# Patient Record
Sex: Female | Born: 1980 | Race: White | Hispanic: No | Marital: Single | Smoking: Former smoker
Health system: Southern US, Community
[De-identification: ages and names within clinical notes are randomized; demographics above are authoritative.]

## PROBLEM LIST (undated history)

## (undated) DIAGNOSIS — Z9189 Other specified personal risk factors, not elsewhere classified: Secondary | ICD-10-CM

## (undated) DIAGNOSIS — F32A Depression, unspecified: Secondary | ICD-10-CM

## (undated) DIAGNOSIS — K2289 Other specified disease of esophagus: Secondary | ICD-10-CM

## (undated) DIAGNOSIS — G44209 Tension-type headache, unspecified, not intractable: Secondary | ICD-10-CM

## (undated) DIAGNOSIS — G43909 Migraine, unspecified, not intractable, without status migrainosus: Secondary | ICD-10-CM

## (undated) DIAGNOSIS — F329 Major depressive disorder, single episode, unspecified: Secondary | ICD-10-CM

## (undated) DIAGNOSIS — K228 Other specified diseases of esophagus: Secondary | ICD-10-CM

## (undated) DIAGNOSIS — F988 Other specified behavioral and emotional disorders with onset usually occurring in childhood and adolescence: Secondary | ICD-10-CM

## (undated) DIAGNOSIS — E785 Hyperlipidemia, unspecified: Secondary | ICD-10-CM

## (undated) DIAGNOSIS — G4700 Insomnia, unspecified: Secondary | ICD-10-CM

## (undated) HISTORY — DX: Depression, unspecified: F32.A

## (undated) HISTORY — DX: Migraine, unspecified, not intractable, without status migrainosus: G43.909

## (undated) HISTORY — PX: APPENDECTOMY: SHX54

## (undated) HISTORY — DX: Other specified personal risk factors, not elsewhere classified: Z91.89

## (undated) HISTORY — DX: Hyperlipidemia, unspecified: E78.5

## (undated) HISTORY — DX: Major depressive disorder, single episode, unspecified: F32.9

---

## 1999-02-04 ENCOUNTER — Emergency Department (HOSPITAL_COMMUNITY): Admission: EM | Admit: 1999-02-04 | Discharge: 1999-02-04 | Payer: Self-pay | Admitting: Podiatry

## 2012-06-02 DIAGNOSIS — G44209 Tension-type headache, unspecified, not intractable: Secondary | ICD-10-CM | POA: Insufficient documentation

## 2012-06-04 ENCOUNTER — Encounter (HOSPITAL_COMMUNITY): Payer: Self-pay | Admitting: *Deleted

## 2012-06-04 ENCOUNTER — Emergency Department (HOSPITAL_COMMUNITY)
Admission: EM | Admit: 2012-06-04 | Discharge: 2012-06-04 | Disposition: A | Discharge status: Home / Self Care | Payer: No Typology Code available for payment source | Attending: Emergency Medicine | Admitting: Emergency Medicine

## 2012-06-04 DIAGNOSIS — Z79899 Other long term (current) drug therapy: Secondary | ICD-10-CM | POA: Insufficient documentation

## 2012-06-04 DIAGNOSIS — G4700 Insomnia, unspecified: Secondary | ICD-10-CM | POA: Insufficient documentation

## 2012-06-04 DIAGNOSIS — F17200 Nicotine dependence, unspecified, uncomplicated: Secondary | ICD-10-CM | POA: Insufficient documentation

## 2012-06-04 DIAGNOSIS — T7421XA Adult sexual abuse, confirmed, initial encounter: Secondary | ICD-10-CM | POA: Insufficient documentation

## 2012-06-04 DIAGNOSIS — Z3202 Encounter for pregnancy test, result negative: Secondary | ICD-10-CM | POA: Insufficient documentation

## 2012-06-04 DIAGNOSIS — F988 Other specified behavioral and emotional disorders with onset usually occurring in childhood and adolescence: Secondary | ICD-10-CM | POA: Insufficient documentation

## 2012-06-04 HISTORY — DX: Other specified behavioral and emotional disorders with onset usually occurring in childhood and adolescence: F98.8

## 2012-06-04 HISTORY — DX: Tension-type headache, unspecified, not intractable: G44.209

## 2012-06-04 HISTORY — DX: Insomnia, unspecified: G47.00

## 2012-06-04 LAB — HIV ANTIBODY (ROUTINE TESTING W REFLEX): HIV: NONREACTIVE

## 2012-06-04 LAB — RPR: RPR Ser Ql: NONREACTIVE

## 2012-06-04 MED ORDER — AZITHROMYCIN 1 G PO PACK
PACK | ORAL | Status: AC
  Administered 2012-06-04: 1 g
  Filled 2012-06-04: qty 1

## 2012-06-04 MED ORDER — NICOTINE 14 MG/24HR TD PT24
14.0000 mg | MEDICATED_PATCH | Freq: Once | TRANSDERMAL | Status: DC
  Administered 2012-06-04: 14 mg via TRANSDERMAL
  Filled 2012-06-04: qty 1

## 2012-06-04 MED ORDER — PROMETHAZINE HCL 25 MG PO TABS
ORAL_TABLET | ORAL | Status: AC
  Filled 2012-06-04: qty 3

## 2012-06-04 MED ORDER — CEFIXIME 400 MG PO TABS
ORAL_TABLET | ORAL | Status: AC
  Administered 2012-06-04: 400 mg
  Filled 2012-06-04: qty 1

## 2012-06-04 MED ORDER — METRONIDAZOLE 500 MG PO TABS
ORAL_TABLET | ORAL | Status: AC
  Administered 2012-06-04: 2000 mg
  Filled 2012-06-04: qty 4

## 2012-06-04 MED ORDER — LEVONORGESTREL 0.75 MG PO TABS
ORAL_TABLET | ORAL | Status: AC
  Administered 2012-06-04: 1.5 mg
  Filled 2012-06-04: qty 2
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
## 2012-06-04 NOTE — ED Notes (Signed)
SANE paged. Dr. Rulon Abide at bedside.
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
## 2013-03-20 ENCOUNTER — Encounter (HOSPITAL_COMMUNITY): Payer: Self-pay | Admitting: Emergency Medicine

## 2013-03-20 ENCOUNTER — Emergency Department (HOSPITAL_COMMUNITY): Payer: BC Managed Care – PPO

## 2013-03-20 ENCOUNTER — Emergency Department (HOSPITAL_COMMUNITY)
Admission: EM | Admit: 2013-03-20 | Discharge: 2013-03-20 | Disposition: A | Discharge status: Other Acute Inpatient Hospital | Payer: BC Managed Care – PPO | Source: Home / Self Care | Attending: Family Medicine | Admitting: Family Medicine

## 2013-03-20 ENCOUNTER — Emergency Department (HOSPITAL_COMMUNITY)
Admission: EM | Admit: 2013-03-20 | Discharge: 2013-03-20 | Disposition: A | Discharge status: Home / Self Care | Payer: BC Managed Care – PPO | Attending: Emergency Medicine | Admitting: Emergency Medicine

## 2013-03-20 DIAGNOSIS — J989 Respiratory disorder, unspecified: Secondary | ICD-10-CM

## 2013-03-20 DIAGNOSIS — F17200 Nicotine dependence, unspecified, uncomplicated: Secondary | ICD-10-CM | POA: Insufficient documentation

## 2013-03-20 DIAGNOSIS — F411 Generalized anxiety disorder: Secondary | ICD-10-CM | POA: Insufficient documentation

## 2013-03-20 DIAGNOSIS — R05 Cough: Secondary | ICD-10-CM

## 2013-03-20 DIAGNOSIS — J029 Acute pharyngitis, unspecified: Secondary | ICD-10-CM | POA: Insufficient documentation

## 2013-03-20 DIAGNOSIS — F988 Other specified behavioral and emotional disorders with onset usually occurring in childhood and adolescence: Secondary | ICD-10-CM | POA: Insufficient documentation

## 2013-03-20 DIAGNOSIS — R531 Weakness: Secondary | ICD-10-CM

## 2013-03-20 DIAGNOSIS — R059 Cough, unspecified: Secondary | ICD-10-CM

## 2013-03-20 DIAGNOSIS — E869 Volume depletion, unspecified: Secondary | ICD-10-CM

## 2013-03-20 DIAGNOSIS — H9209 Otalgia, unspecified ear: Secondary | ICD-10-CM | POA: Insufficient documentation

## 2013-03-20 DIAGNOSIS — R5383 Other fatigue: Secondary | ICD-10-CM

## 2013-03-20 DIAGNOSIS — Z79899 Other long term (current) drug therapy: Secondary | ICD-10-CM | POA: Insufficient documentation

## 2013-03-20 DIAGNOSIS — R5381 Other malaise: Secondary | ICD-10-CM | POA: Insufficient documentation

## 2013-03-20 HISTORY — DX: Other specified disease of esophagus: K22.89

## 2013-03-20 HISTORY — DX: Other specified diseases of esophagus: K22.8

## 2013-03-20 LAB — BASIC METABOLIC PANEL
BUN: 10 mg/dL (ref 6–23)
CO2: 25 meq/L (ref 19–32)
Calcium: 9.4 mg/dL (ref 8.4–10.5)
Chloride: 98 mEq/L (ref 96–112)
Creatinine, Ser: 0.69 mg/dL (ref 0.50–1.10)
GFR calc Af Amer: >90 mL/min (ref >90)
GFR calc non Af Amer: >90 mL/min (ref >90)
GLUCOSE: 75 mg/dL (ref 70–99)
POTASSIUM: 3.7 meq/L (ref 3.7–5.3)
SODIUM: 141 meq/L (ref 137–147)

## 2013-03-20 LAB — CBC WITH DIFFERENTIAL/PLATELET
BASOS PCT: 1 % (ref 0–1)
Basophils Absolute: 0 10*3/uL (ref 0.0–0.1)
EOS PCT: 4 % (ref 0–5)
Eosinophils Absolute: 0.2 10*3/uL (ref 0.0–0.7)
HEMATOCRIT: 40.9 % (ref 36.0–46.0)
HEMOGLOBIN: 14.5 g/dL (ref 12.0–15.0)
LYMPHS PCT: 52 % — AB (ref 12–46)
Lymphs Abs: 2.4 10*3/uL (ref 0.7–4.0)
MCH: 31.1 pg (ref 26.0–34.0)
MCHC: 35.5 g/dL (ref 30.0–36.0)
MCV: 87.8 fL (ref 78.0–100.0)
Monocytes Absolute: 0.4 10*3/uL (ref 0.1–1.0)
Monocytes Relative: 8 % (ref 3–12)
NEUTROS ABS: 1.6 10*3/uL — AB (ref 1.7–7.7)
NEUTROS PCT: 35 % — AB (ref 43–77)
Platelets: 221 10*3/uL (ref 150–400)
RBC: 4.66 MIL/uL (ref 3.87–5.11)
RDW: 13.7 % (ref 11.5–15.5)
WBC: 4.6 10*3/uL (ref 4.0–10.5)

## 2013-03-20 LAB — GLUCOSE, CAPILLARY: GLUCOSE-CAPILLARY: 105 mg/dL — AB (ref 70–99)

## 2013-03-20 MED ORDER — ALBUTEROL SULFATE (2.5 MG/3ML) 0.083% IN NEBU
5.0000 mg | INHALATION_SOLUTION | Freq: Once | RESPIRATORY_TRACT | Status: AC
  Administered 2013-03-20: 5 mg via RESPIRATORY_TRACT
  Filled 2013-03-20: qty 6

## 2013-03-20 MED ORDER — PROMETHAZINE-CODEINE 6.25-10 MG/5ML PO SYRP: 5.0000 mL | ORAL_SOLUTION | Freq: Four times a day (QID) | ORAL | Status: DC | PRN

## 2013-03-20 MED ORDER — SODIUM CHLORIDE 0.9 % IV BOLUS (SEPSIS): 1000.0000 mL | Freq: Once | INTRAVENOUS | Status: DC

## 2013-03-20 MED ORDER — SODIUM CHLORIDE 0.9 % IV SOLN: Freq: Once | INTRAVENOUS | Status: DC

## 2013-03-20 MED ORDER — LEVOFLOXACIN 750 MG PO TABS: 750.0000 mg | ORAL_TABLET | Freq: Every day | ORAL | Status: DC

## 2013-03-20 MED ORDER — IPRATROPIUM BROMIDE 0.02 % IN SOLN
0.5000 mg | Freq: Once | RESPIRATORY_TRACT | Status: AC
  Administered 2013-03-20: 0.5 mg via RESPIRATORY_TRACT
  Filled 2013-03-20: qty 2.5

## 2013-03-20 NOTE — ED Notes (Signed)
[facility] of [facility] and weakness.  Pt reports cough has been present for 4 days- productive in nature.  Reports generalized body aches and weakness.  CBG 104.  Sinus rhythm on monitor.
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
## 2013-03-20 NOTE — ED Notes (Signed)
Pt returned from xray

## 2013-03-20 NOTE — ED Notes (Signed)
Attempted IV access x 2 without success. KDL EMT-P 

## 2013-03-20 NOTE — Discharge Instructions (Signed)
Take the prescribed medication as directed. Follow-up with your primary care physician for monitoring of chest mass-- i have attached copies of your latest CXR report for comparison. Return to the ED for new or worsening symptoms.

## 2013-03-20 NOTE — ED Notes (Signed)
Pt removed cardiac monitor and BP cuff, tearful at present- pt states "I am having an anxiety attack, I cannot stay here"  Pt speaking in full sentences, this RN at bedside to calm pt down.  Pt agrees to stay for chest x-ray and breathing treatment, refusing IV at this time.

## 2013-03-20 NOTE — ED Notes (Signed)
Lab called to check on blood results- phlebotomy paged about results.

## 2013-03-20 NOTE — ED Notes (Signed)
Pt  Reports  Symptoms  Of  Cough   Congested   X  4  Days         Reports   Has  Been  Fatigued           Pt  Reports  Fullness  r  Ear

## 2013-03-20 NOTE — ED Provider Notes (Signed)
CSN: [id number]     Arrival date & time 03/20/13  1758 History   First MD Initiated Contact with Patient 03/20/13 1807     Chief Complaint  Patient presents with  . Cough     (Consider location/radiation/quality/duration/timing/severity/associated sxs/prior Treatment) HPI Comments: Patient presents today with 4-5 days of  Non-productive cough, congestion, fever, chills, and not eating or drinking. She states that she is dizzy, and "delerious" and can "hardly stand up". She drove herself here.   Patient is a 33 y.o. female presenting with cough. The history is provided by the patient.  Cough Associated symptoms: chills, fever, headaches and shortness of breath   Associated symptoms: no wheezing     Past Medical History  Diagnosis Date  . ADD (attention deficit disorder)   . Insomnia   . Tension headache    Past Surgical History  Procedure Laterality Date  . Appendectomy     History reviewed. No pertinent family history. History  Substance Use Topics  . Smoking status: Current Some Day Smoker  . Smokeless tobacco: Not on file  . Alcohol Use: Yes     Comment: socially   OB History   Grav Para Term Preterm Abortions TAB SAB Ect Mult Living                 Review of Systems  Constitutional: Positive for fever, chills and fatigue.  HENT: Positive for congestion and sinus pressure.   Respiratory: Positive for cough and shortness of breath. Negative for wheezing and stridor.   Cardiovascular: Negative.   Genitourinary: Negative.   Neurological: Positive for dizziness, light-headedness and headaches.  Psychiatric/Behavioral: Positive for confusion. Negative for hallucinations.      Allergies  Other  Home Medications   Current Outpatient Rx  Name  Route  Sig  Dispense  Refill  . amphetamine-dextroamphetamine (ADDERALL) 10 MG tablet   Oral   Take 10 mg by mouth 3 (three) times daily.         . clonazePAM (KLONOPIN) 1 MG tablet   Oral   Take 3 mg by mouth at  bedtime as needed. For insomnia          BP 121/86  Pulse 121  Temp(Src) 97.9 F (36.6 C) (Oral)  Resp 19  SpO2 98%  LMP 03/13/2013 Physical Exam  ED Course  Procedures (including critical care time) Labs Review Labs Reviewed - No data to display Imaging Review No results found.    MDM   Final diagnoses:  Volume depletion  Respiratory illness    Patient is orthostatic, with dizziness and confusion and an underlying respiratory infection. Discussed with Dr. Denyse Amassorey will start IV fluids and  send to ER via [facility]    Riki SheerMichelle G Young, PA-C 03/20/13 1835

## 2013-03-20 NOTE — ED Notes (Signed)
RN went in to start IV since previous unsuccessful attempts by other RN. Pt is sobbing and reports she is refusing IV. RN calmed down pt and brought her warm blankets. Pt reports she will just drink water. PA and primary RN aware.

## 2013-03-20 NOTE — ED Notes (Signed)
Pt tearful and unhappyMisty Stanley- Lisa, GeorgiaPA at bedside talking with pt.

## 2013-03-20 NOTE — ED Notes (Signed)
Pt refused another set of vital signs before discharge- removed cardiac monitor and BP cuff by self for the second time.  Pt ambulatory to discharge desk without difficulty- security called for transport to patient car.

## 2013-03-20 NOTE — ED Notes (Signed)
Attempted  Iv   X  3

## 2013-03-21 NOTE — ED Provider Notes (Signed)
Medical screening examination/treatment/procedure(s) were performed by a resident physician or non-physician practitioner and as the supervising physician I was immediately available for consultation/collaboration.  Clementeen GrahamEvan Shadai Mcclane, MD    Rodolph BongEvan S Aren Pryde, MD 03/21/13 2017

## 2013-03-21 NOTE — ED Provider Notes (Signed)
Medical screening examination/treatment/procedure(s) were performed by non-physician practitioner and as supervising physician I was immediately available for consultation/collaboration.  EKG Interpretation    Date/Time:  Sunday March 20 2013 20:26:17 EST Ventricular Rate:  81 PR Interval:  99 QRS Duration: 78 QT Interval:  466 QTC Calculation: 541 R Axis:   80 Text Interpretation:  Sinus rhythm Short PR interval Prolonged QT interval No old tracing to compare Confirmed by Kaelen Brennan  MD-I, Bryleigh Ottaway ([id number]) on 03/20/2013 8:55:14 PM            Devoria AlbeIva Ashaun Gaughan, MD, Franz DellFACEP   Theona Muhs L Aneisha Skyles, MD 03/21/13 [phone]

## 2013-03-22 LAB — PATHOLOGIST SMEAR REVIEW

## 2013-11-20 ENCOUNTER — Ambulatory Visit (INDEPENDENT_AMBULATORY_CARE_PROVIDER_SITE_OTHER): Payer: BC Managed Care – PPO | Admitting: Physician Assistant

## 2013-11-20 VITALS — BP 106/68 | HR 101 | Temp 97.5°F | Resp 17 | Ht 65.5 in | Wt 108.0 lb

## 2013-11-20 DIAGNOSIS — F909 Attention-deficit hyperactivity disorder, unspecified type: Secondary | ICD-10-CM

## 2013-11-20 DIAGNOSIS — R300 Dysuria: Secondary | ICD-10-CM

## 2013-11-20 DIAGNOSIS — N3000 Acute cystitis without hematuria: Secondary | ICD-10-CM

## 2013-11-20 DIAGNOSIS — G44229 Chronic tension-type headache, not intractable: Secondary | ICD-10-CM

## 2013-11-20 DIAGNOSIS — F988 Other specified behavioral and emotional disorders with onset usually occurring in childhood and adolescence: Secondary | ICD-10-CM | POA: Insufficient documentation

## 2013-11-20 DIAGNOSIS — K143 Hypertrophy of tongue papillae: Secondary | ICD-10-CM

## 2013-11-20 DIAGNOSIS — N898 Other specified noninflammatory disorders of vagina: Secondary | ICD-10-CM

## 2013-11-20 DIAGNOSIS — Z113 Encounter for screening for infections with a predominantly sexual mode of transmission: Secondary | ICD-10-CM

## 2013-11-20 DIAGNOSIS — Z23 Encounter for immunization: Secondary | ICD-10-CM

## 2013-11-20 DIAGNOSIS — R109 Unspecified abdominal pain: Secondary | ICD-10-CM

## 2013-11-20 LAB — POCT URINALYSIS DIPSTICK
BILIRUBIN UA: NEGATIVE
Glucose, UA: NEGATIVE
KETONES UA: NEGATIVE
Nitrite, UA: NEGATIVE
PH UA: 6.5
Protein, UA: NEGATIVE
Urobilinogen, UA: 0.2

## 2013-11-20 LAB — POCT UA - MICROSCOPIC ONLY
Casts, Ur, LPF, POC: NEGATIVE
Crystals, Ur, HPF, POC: NEGATIVE
MUCUS UA: NEGATIVE
Yeast, UA: NEGATIVE

## 2013-11-20 LAB — POCT CBC
GRANULOCYTE PERCENT: 73.9 % (ref 37–80)
HEMATOCRIT: 39.1 % (ref 37.7–47.9)
Hemoglobin: 12.6 g/dL (ref 12.2–16.2)
Lymph, poc: 1.5 (ref 0.6–3.4)
MCH, POC: 30.8 pg (ref 27–31.2)
MCHC: 32.2 g/dL (ref 31.8–35.4)
MCV: 95.7 fL (ref 80–97)
MID (cbc): 0.5 (ref 0–0.9)
MPV: 5.8 fL (ref 0–99.8)
PLATELET COUNT, POC: 325 10*3/uL (ref 142–424)
POC GRANULOCYTE: 5.8 (ref 2–6.9)
POC LYMPH %: 19.4 % (ref 10–50)
POC MID %: 6.7 %M (ref 0–12)
RBC: 4.08 M/uL (ref 4.04–5.48)
RDW, POC: 13.2 %
WBC: 7.9 10*3/uL (ref 4.6–10.2)

## 2013-11-20 LAB — POCT WET PREP WITH KOH
Clue Cells Wet Prep HPF POC: NEGATIVE
KOH Prep POC: NEGATIVE
Trichomonas, UA: NEGATIVE
YEAST WET PREP PER HPF POC: NEGATIVE

## 2013-11-20 LAB — POCT SKIN KOH: Skin KOH, POC: NEGATIVE

## 2013-11-20 LAB — RPR

## 2013-11-20 MED ORDER — SULFAMETHOXAZOLE-TRIMETHOPRIM 800-160 MG PO TABS: 1.0000 | ORAL_TABLET | Freq: Two times a day (BID) | ORAL | Status: DC

## 2013-11-20 NOTE — Progress Notes (Signed)
Subjective:    Patient ID: Norma Elliott, female    DOB: April 25, 1980, 33 y.o.   MRN: [id number]  PROVIDER NOT IN SYSTEM  Chief Complaint  Patient presents with  . Vaginitis  . Dysuria  . Exposure to STD  . Foot Pain    heel pain   . Flank Pain    patient states she has kidney pain   . Fatigue  . Thrush   There are no active problems to display for this patient.  Prior to Admission medications   Medication Sig Start Date End Date Taking? Authorizing Provider  amphetamine-dextroamphetamine (ADDERALL) 10 MG tablet Take 20 mg by mouth 3 (three) times daily.    Yes Historical Provider, MD  clonazePAM (KLONOPIN) 1 MG tablet Take 2 mg by mouth at bedtime as needed. For insomnia   Yes Historical Provider, MD  cyclobenzaprine (FLEXERIL) 10 MG tablet Take 10 mg by mouth 3 (three) times daily as needed (Headaches).  03/09/13  Yes Historical Provider, MD  traZODone (DESYREL) 100 MG tablet Take 100 mg by mouth at bedtime.  02/18/13  Yes Historical Provider, MD  butalbital-acetaminophen-caffeine (FIORICET WITH CODEINE) 50-325-40-30 MG per capsule Take 1-2 capsules by mouth every 4 (four) hours as needed for headache.    Historical Provider, MD  Fexofenadine HCl (MUCINEX ALLERGY PO) Take 1 tablet by mouth daily as needed (congestion).    Historical Provider, MD   Medications, allergies, past medical history, surgical history, family history, social history and problem list reviewed and updated.  Dysuria  Associated symptoms include flank pain.  Exposure to STD  The patient's primary symptoms include dysuria.  Foot Pain  Flank Pain Associated symptoms include dysuria.    [age] yof with PMH numerous vaginal yeast infections and two oral thrush infections presents with dysuria and flank pain.   3 weeks ago had unprotected sex. Several days after the encounter she began to have slightly more vaginal discharge than her baseline. Has had vaginal itchiness since the encounter. She has also had  dysuria, increased freq and increased urgency for past 2 weeks. No odor. No dyspareunia. She states she is susceptible to yeast infections so she called her pharmacy several weeks and they called in 2 rounds diflucan. Took 1st pill Oct 7th and 2nd pill Oct 14th with no relief. Has also used monostat copiously - 7 day supply in 3 days.   She has had right sided flank pain past 3-4 days and left sided flank pain as of this morning. Unsure if blood in urine currently as on period. She denies any fever, chills, N/V, or excessive fatigue.   She also mentions that she has noticed some abnormal white growth on the back of her tongue.   She is interested in HIV/syphillis testing.   Review of Systems  Genitourinary: Positive for dysuria and flank pain.   See HPI. No CP, SOB, constipation, diarrhea.       Objective:   Physical Exam  Constitutional: She is oriented to person, place, and time. She appears well-developed and well-nourished.  Non-toxic appearance. She does not have a sickly appearance. She does not appear ill.  BP 106/68  Pulse 101  Temp(Src) 97.5 F (36.4 C) (Oral)  Resp 17  Ht 5' 5.5" (1.664 m)  Wt 108 lb (48.988 kg)  BMI 17.69 kg/m2  SpO2 100%   HENT:  Mouth/Throat: Uvula is midline and mucous membranes are normal. No oral lesions. No oropharyngeal exudate or posterior oropharyngeal edema.  Cardiovascular: Normal rate, regular  rhythm, S1 normal, S2 normal and normal heart sounds.  Exam reveals no gallop.   No murmur heard. Pulmonary/Chest: Effort normal and breath sounds normal. She has no decreased breath sounds. She has no wheezes. She has no rhonchi. She has no rales.  Abdominal: Soft. Bowel sounds are normal. There is tenderness in the suprapubic area and left lower quadrant. There is CVA tenderness.  Genitourinary: Vagina normal. There is no rash or tenderness on the right labia. There is no rash or tenderness on the left labia. Cervix exhibits no discharge and no  friability. No erythema or bleeding around the vagina. No vaginal discharge found.  Lymphadenopathy:       Right: No inguinal adenopathy present.       Left: No inguinal adenopathy present.  Neurological: She is alert and oriented to person, place, and time.  Psychiatric: She has a normal mood and affect. Her speech is normal.   Results for orders placed in visit on 11/20/13  POCT UA - MICROSCOPIC ONLY      Result Value Ref Range   WBC, Ur, HPF, POC 8-12     RBC, urine, microscopic 2-4     Bacteria, U Microscopic small     Mucus, UA neg     Epithelial cells, urine per micros 1-4     Crystals, Ur, HPF, POC neg     Casts, Ur, LPF, POC neg     Yeast, UA neg    POCT URINALYSIS DIPSTICK      Result Value Ref Range   Color, UA yellow     Clarity, UA hazy     Glucose, UA neg     Bilirubin, UA neg     Ketones, UA neg     Spec Grav, UA <=1.005     Blood, UA trace     pH, UA 6.5     Protein, UA neg     Urobilinogen, UA 0.2     Nitrite, UA neg     Leukocytes, UA small (1+)    POCT WET PREP WITH KOH      Result Value Ref Range   Trichomonas, UA Negative     Clue Cells Wet Prep HPF POC neg     Epithelial Wet Prep HPF POC 3-6     Yeast Wet Prep HPF POC neg     Bacteria Wet Prep HPF POC 1+     RBC Wet Prep HPF POC 0-2     WBC Wet Prep HPF POC 5-8     KOH Prep POC Negative    POCT CBC      Result Value Ref Range   WBC 7.9  4.6 - 10.2 K/uL   Lymph, poc 1.5  0.6 - 3.4   POC LYMPH PERCENT 19.4  10 - 50 %L   MID (cbc) 0.5  0 - 0.9   POC MID % 6.7  0 - 12 %M   POC Granulocyte 5.8  2 - 6.9   Granulocyte percent 73.9  37 - 80 %G   RBC 4.08  4.04 - 5.48 M/uL   Hemoglobin 12.6  12.2 - 16.2 g/dL   HCT, POC 16.139.1  09.637.7 - 47.9 %   MCV 95.7  80 - 97 fL   MCH, POC 30.8  27 - 31.2 pg   MCHC 32.2  31.8 - 35.4 g/dL   RDW, POC 04.513.2     Platelet Count, POC 325  142 - 424 K/uL   MPV 5.8  0 -  99.8 fL  POCT SKIN KOH      Result Value Ref Range   Skin KOH, POC Negative         Assessment &  Plan:   89 yof with PMH numerous vaginal yeast infections and two oral thrush infections presents with dysuria and flank pain.   Dysuria - Plan: POCT UA - Microscopic Only, POCT urinalysis dipstick, Urine culture --UA dilute sample but with 1+ leukocytes --Bactrim  Vaginal discharge - Plan: POCT Wet Prep with KOH, RPR --Wet prep negative --Awaiting CT/GC uriprobe results, though doubt positive with lack of vaginal/cervical discharge.  Flank pain - Plan: POCT CBC --No fever, chills, N/V, hematuria --No leukocytosis --Unlikely to be pyelonephritis or stone.  --No rash over area, no muscle spasm.  --RTC if not resolved in one week or if sx of pyelo present.  Need for prophylactic vaccination and inoculation against influenza - Plan: Flu Vaccine QUAD 36+ mos PF IM (Fluarix Quad PF)  Tongue coating - Plan: POCT Skin KOH --Minimal amount of white overgrowth present and obtained. --KOH neg for candida. --Did not appear to be infectious overgrowth --RTC if worsening  Acute cystitis without hematuria - Plan: sulfamethoxazole-trimethoprim (BACTRIM DS,SEPTRA DS) 800-160 MG per tablet --Bactrim --RTC if sx not resolved in 3 days  Screen for STD --Only 18 days from unprotected sexual encounter so HIV testing not yet appropriate, encouraged to RTC for testing in 2 months if interested. RTC if sx of immunosuppression. --Syphillis testing today  Donnajean Lopes, PA-C Physician Assistant-Certified [facility]  11/20/2013 9:47 AM

## 2013-11-20 NOTE — Patient Instructions (Addendum)
Your results were negative for bacterial vaginosis, yeast infection, or trichomonas.  We will contact with you the remaining lab results.  You can be tested for HIV in 2 months time if you are interested.  Your result showed a UTI. Please take the bactrim twice daily for 5 days.  Your results were negative for oral thrush.  If you are not feeling better in 3-4 days please return to clinic.

## 2013-11-22 ENCOUNTER — Telehealth: Payer: Self-pay

## 2013-11-22 LAB — URINE CULTURE: Colony Count: 55000

## 2013-11-22 MED ORDER — NITROFURANTOIN MONOHYD MACRO 100 MG PO CAPS: 100.0000 mg | ORAL_CAPSULE | Freq: Two times a day (BID) | ORAL | Status: DC

## 2013-11-22 NOTE — Telephone Encounter (Signed)
Pts not feeling better because the bacteria in her urine is resistant to the abx we picked. New abx sent to pharmacy.

## 2013-11-22 NOTE — Telephone Encounter (Signed)
Labs are still pending only have preliminary.

## 2013-11-22 NOTE — Telephone Encounter (Signed)
Pt has been advised.

## 2013-11-22 NOTE — Telephone Encounter (Signed)
Please review

## 2013-11-22 NOTE — Telephone Encounter (Signed)
Patient has questions regarding her medication.  Worse with the medication.   Unbearable - glass chard feeling going to the bathroom every two to three minutes.  Painful.   [phone]

## 2013-11-29 ENCOUNTER — Telehealth: Payer: Self-pay

## 2013-11-29 NOTE — Telephone Encounter (Signed)
Spoke with pt. Advised to RTC. She will give it a day or 2 and then RTC if it doesn't get well.

## 2013-11-29 NOTE — Telephone Encounter (Signed)
Pt states she has finshed antibiotic for e-coli uti and still has some symptoms,please advise   Best phone [phone]  Pharmacy [facility] [address]

## 2013-12-04 ENCOUNTER — Other Ambulatory Visit: Payer: Self-pay

## 2013-12-04 NOTE — Telephone Encounter (Signed)
Pt calling for refill on urinary tract medication, she states that she has financial hardships preventing her from coming into the office, but is in "extreme pain". She was seen here on 11/20/13 for similar symptoms and was prescribed something, that seemed to help. I gave her several options of other facilities currently open, that offered billing. I advised her that being that we are currently closed, and are not staffed to do rx refills on weekends that reciving a response on this could take about 24-72 hours, and if she felt this was emergent she should reach out to one of the options I provided. Please advise pt.

## 2013-12-05 NOTE — Telephone Encounter (Signed)
Pt stated that she did get somewhat better while on Abx but as soon as she finished them she got worse again and now is even worse. She is in a lot of pain, has no fever but "her organs hurt". I advised she really does need to get in to see someone and she said she has an appt tomorrow w/her reg MD but is really getting afraid because she is hurting worse and has had this for so long. Pt reqs that we call in a little more of the same Abx she was on that was starting to help tonight so she can get started back on it and she will keep her appt w/her MD tomorrow. She [facility] that [facility], please send to a 24 hr pharm (pt lives off [address]).

## 2013-12-05 NOTE — Telephone Encounter (Signed)
When she called 11/03, she was advised to come in to be seen. She really needs re-evaluation so that the correct treatment can be prescribed. She can return here, or seek care at another facility.

## 2014-02-28 DIAGNOSIS — F331 Major depressive disorder, recurrent, moderate: Secondary | ICD-10-CM | POA: Insufficient documentation

## 2014-08-15 ENCOUNTER — Ambulatory Visit (INDEPENDENT_AMBULATORY_CARE_PROVIDER_SITE_OTHER): Payer: BLUE CROSS/BLUE SHIELD

## 2014-08-15 ENCOUNTER — Ambulatory Visit (INDEPENDENT_AMBULATORY_CARE_PROVIDER_SITE_OTHER): Payer: BLUE CROSS/BLUE SHIELD | Admitting: Emergency Medicine

## 2014-08-15 VITALS — BP 118/74 | HR 113 | Temp 98.2°F | Resp 16 | Ht 65.0 in | Wt 108.1 lb

## 2014-08-15 DIAGNOSIS — S93401A Sprain of unspecified ligament of right ankle, initial encounter: Secondary | ICD-10-CM

## 2014-08-15 DIAGNOSIS — M25571 Pain in right ankle and joints of right foot: Secondary | ICD-10-CM | POA: Diagnosis not present

## 2014-08-15 MED ORDER — HYDROCODONE-ACETAMINOPHEN 5-325 MG PO TABS: 1.0000 | ORAL_TABLET | ORAL | Status: DC | PRN

## 2014-08-15 NOTE — Patient Instructions (Signed)

## 2014-08-15 NOTE — Progress Notes (Signed)
Subjective:  Patient ID: Norma Elliott, female    DOB: May 10, 1980  Age: 34 y.o. MRN: [id number]  CC: Ankle Injury   HPI Norma Elliott presents  with a history of ankle injury. She missed a step and her house and has marked pain in her forefoot and ankle. She has some swelling that and some bruising that she says is improved this as occurred a couple days ago and she is very vague about what's happened the interval. She's been keeping it elevated and iced. And is ambulating with little difficulty. She's had no improvement with over-the-counter medication  History Norma Elliott has a past medical history of ADD (attention deficit disorder); Insomnia; Tension headache; and Esophageal mass.   She has past surgical history that includes Appendectomy.   Her  family history includes COPD in her father; Cancer in her mother.  She  reports that she has quit smoking. Her smoking use included E-cigarettes. She has never used smokeless tobacco. She reports that she drinks about 7.2 oz of alcohol per week. She reports that she does not use illicit drugs.  Outpatient Prescriptions Prior to Visit  Medication Sig Dispense Refill  . amphetamine-dextroamphetamine (ADDERALL) 10 MG tablet Take 20 mg by mouth 3 (three) times daily.     . butalbital-acetaminophen-caffeine (FIORICET WITH CODEINE) 50-325-40-30 MG per capsule Take 1-2 capsules by mouth every 4 (four) hours as needed for headache.    . clonazePAM (KLONOPIN) 1 MG tablet Take 2 mg by mouth at bedtime as needed. For insomnia    . cyclobenzaprine (FLEXERIL) 10 MG tablet Take 10 mg by mouth 3 (three) times daily as needed (Headaches).     . traZODone (DESYREL) 100 MG tablet Take 100 mg by mouth at bedtime.     Marland Kitchen Fexofenadine HCl (MUCINEX ALLERGY PO) Take 1 tablet by mouth daily as needed (congestion).    . nitrofurantoin, macrocrystal-monohydrate, (MACROBID) 100 MG capsule Take 1 capsule (100 mg total) by mouth 2 (two) times daily. 14 capsule 0   No  facility-administered medications prior to visit.    History   Social History  . Marital Status: Single    Spouse Name: N/A  . Number of Children: N/A  . Years of Education: N/A   Social History Main Topics  . Smoking status: Former Smoker    Types: E-cigarettes  . Smokeless tobacco: Never Used  . Alcohol Use: 7.2 oz/week    10 Glasses of wine, 2 Standard drinks or equivalent per week     Comment: socially  . Drug Use: No  . Sexual Activity: Yes     Comment: Twice in last month - same female - unprotected - oral and vaginal   Other Topics Concern  . None   Social History Narrative     Review of Systems  Constitutional: Negative for fever, chills and appetite change.  HENT: Negative for congestion, ear pain, postnasal drip, sinus pressure and sore throat.   Eyes: Negative for pain and redness.  Respiratory: Negative for cough, shortness of breath and wheezing.   Cardiovascular: Negative for leg swelling.  Gastrointestinal: Negative for nausea, vomiting, abdominal pain, diarrhea, constipation and blood in stool.  Endocrine: Negative for polyuria.  Genitourinary: Negative for dysuria, urgency, frequency and flank pain.  Musculoskeletal: Negative for gait problem.  Skin: Negative for rash.  Neurological: Negative for weakness and headaches.  Psychiatric/Behavioral: Negative for confusion and decreased concentration. The patient is not nervous/anxious.     Objective:  BP 118/74 mmHg  Pulse 113  Temp(Src) 98.2 F (36.8 C) (Oral)  Resp 16  Ht 5\' 5"  (1.651 m)  Wt 108 lb 2 oz (49.045 kg)  BMI 17.99 kg/m2  SpO2 99%  LMP 08/01/2014 (Approximate)  Physical Exam  Constitutional: She is oriented to person, place, and time. She appears well-developed and well-nourished.  HENT:  Head: Normocephalic and atraumatic.  Eyes: Conjunctivae are normal. Pupils are equal, round, and reactive to light.  Pulmonary/Chest: Effort normal.  Musculoskeletal: She exhibits no edema.        Right ankle: She exhibits decreased range of motion and swelling. She exhibits no ecchymosis.  Neurological: She is alert and oriented to person, place, and time.  Skin: Skin is dry.  Psychiatric: She has a normal mood and affect. Her behavior is normal. Thought content normal.      Assessment & Plan:   Norma Elliott was seen today for ankle injury.  Diagnoses and all orders for this visit:  Pain in joint, ankle and foot, right Orders: -     DG Ankle Complete Right; Future -     DG Foot Complete Right; Future  Sprain of ankle, right, initial encounter  Other orders -     HYDROcodone-acetaminophen (NORCO) 5-325 MG per tablet; Take 1-2 tablets by mouth every 4 (four) hours as needed.   I have discontinued Ms. Mogensen's Fexofenadine HCl (MUCINEX ALLERGY PO) and nitrofurantoin (macrocrystal-monohydrate). I am also having her start on HYDROcodone-acetaminophen. Additionally, I am having her maintain her amphetamine-dextroamphetamine, clonazePAM, cyclobenzaprine, traZODone, and butalbital-acetaminophen-caffeine.  Meds ordered this encounter  Medications  . HYDROcodone-acetaminophen (NORCO) 5-325 MG per tablet    Sig: Take 1-2 tablets by mouth every 4 (four) hours as needed.    Dispense:  30 tablet    Refill:  0    Appropriate red flag conditions were discussed with the patient as well as actions that should be taken.  Patient expressed his understanding.  Follow-up: Return if symptoms worsen or fail to improve.  Carmelina DaneAnderson, Remmington Teters S, MD   [facility] reading (PRIMARY) by  Dr. Dareen PianoAnderson.  Negative .

## 2014-08-21 ENCOUNTER — Telehealth: Payer: Self-pay

## 2014-08-21 NOTE — Telephone Encounter (Addendum)
Pt states her ankle is not getting better and she needs an extended oow note, Please call [phone]    Pt also states she needs disc of ankle x-ray taken,she is taking it to her primary phy

## 2014-08-21 NOTE — Telephone Encounter (Signed)
You originally put pt back to work on 08/18/14. Okay to extend?  I will make x-ray disc.

## 2014-08-22 NOTE — Telephone Encounter (Signed)
Attempted to call pt and inform her. VM not set up so unable to leave VM.

## 2014-08-22 NOTE — Telephone Encounter (Signed)
She may come in here and be reevaluated for work note or obtain one from [facility].  Or we can refer to ortho

## 2015-12-24 IMAGING — CR DG ANKLE COMPLETE 3+V*R*
4 series · 4 of 4 positions shown · non-contrast
Comparison: None.

CLINICAL DATA: 33-year-old female with fall and foot pain

EXAM:
RIGHT ANKLE - COMPLETE 3+ VIEW; RIGHT FOOT COMPLETE - 3+ VIEW

[AP]
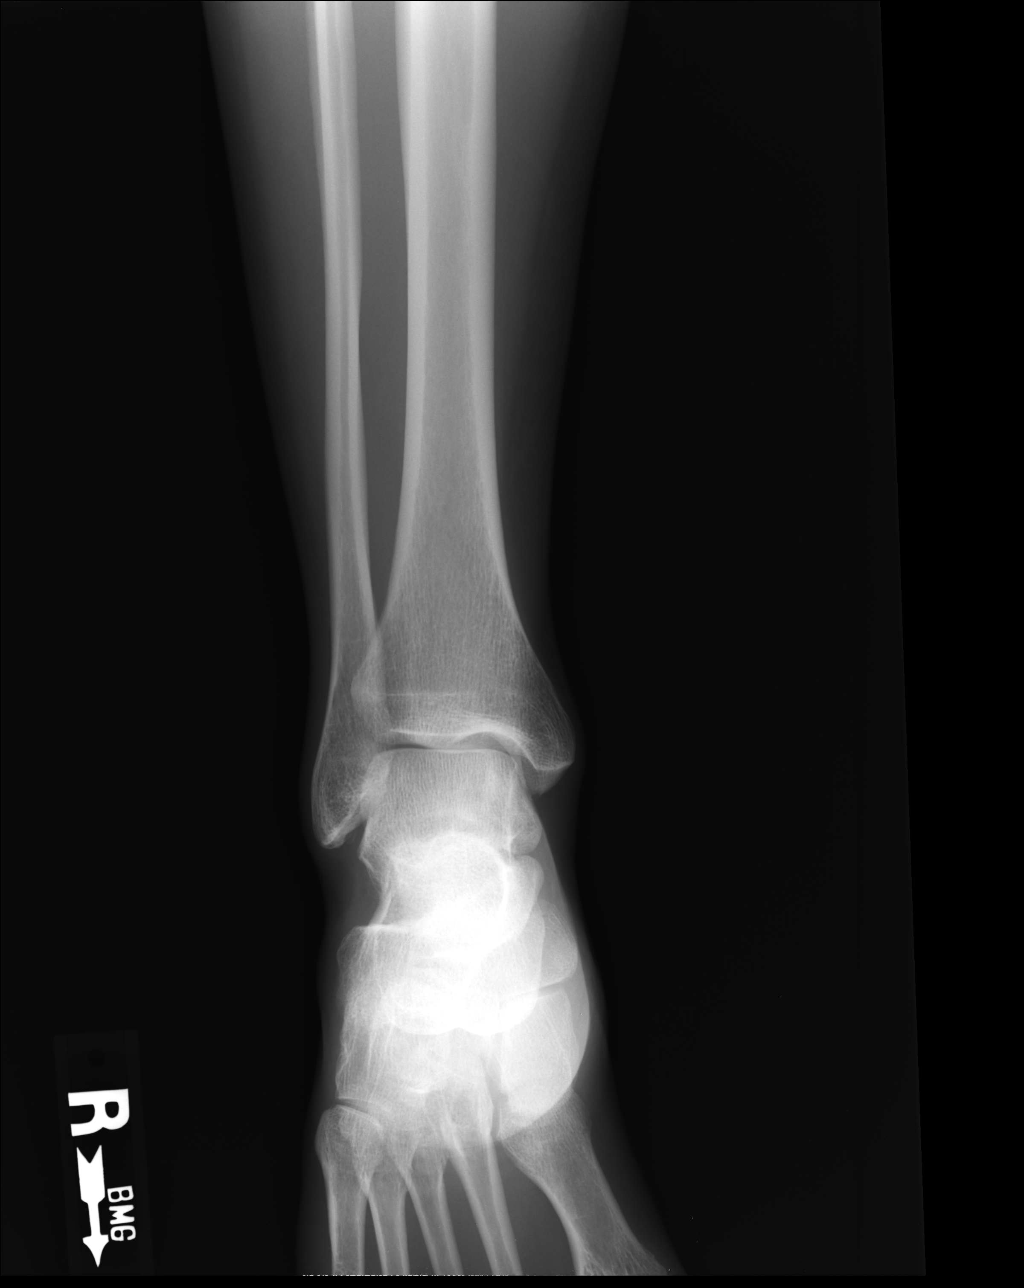

[ap obl int rot]
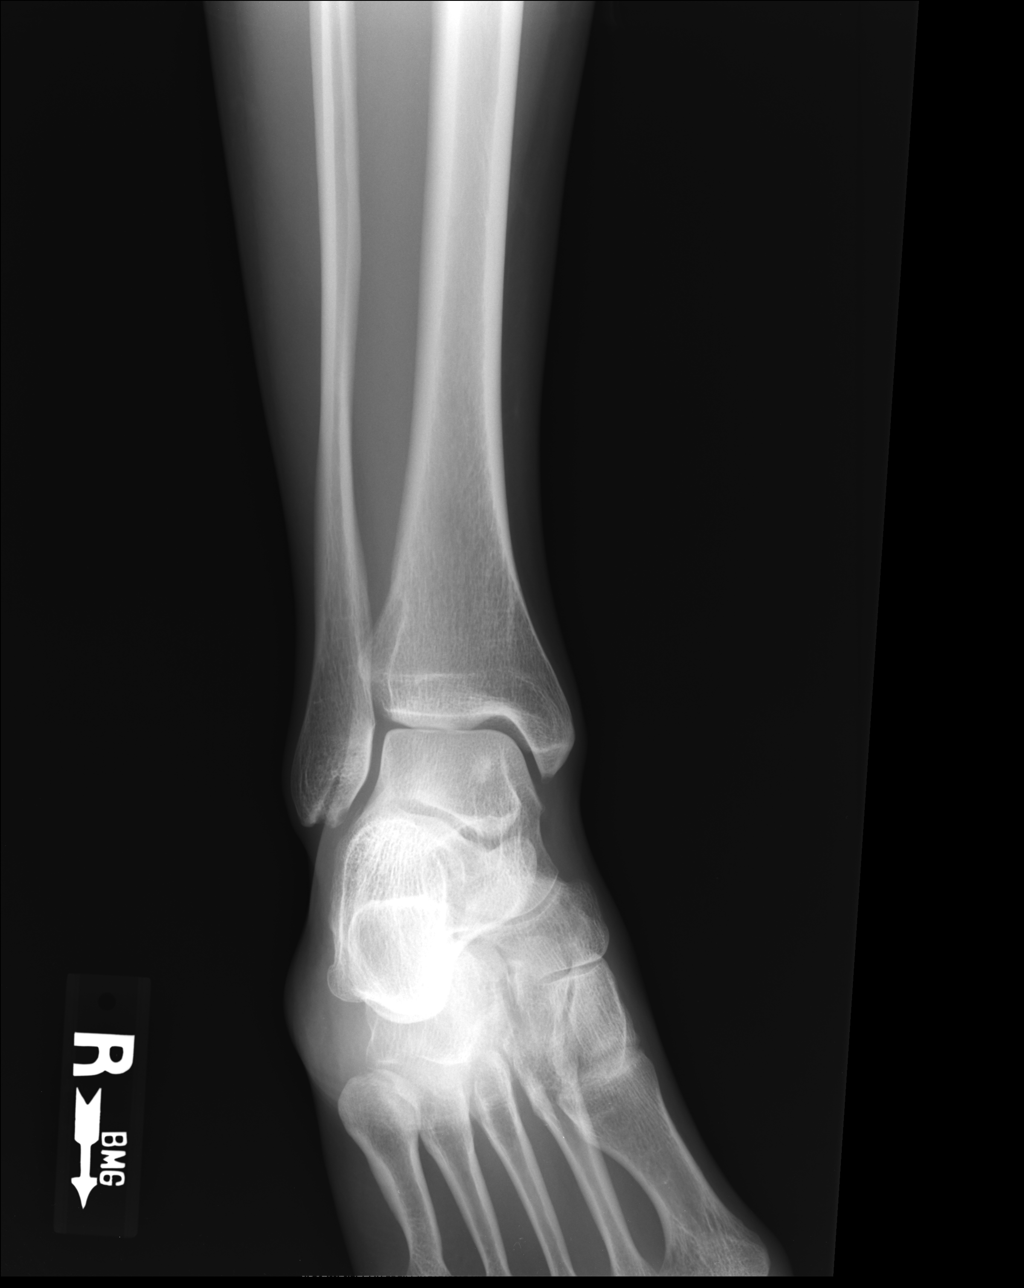

[medial obl]
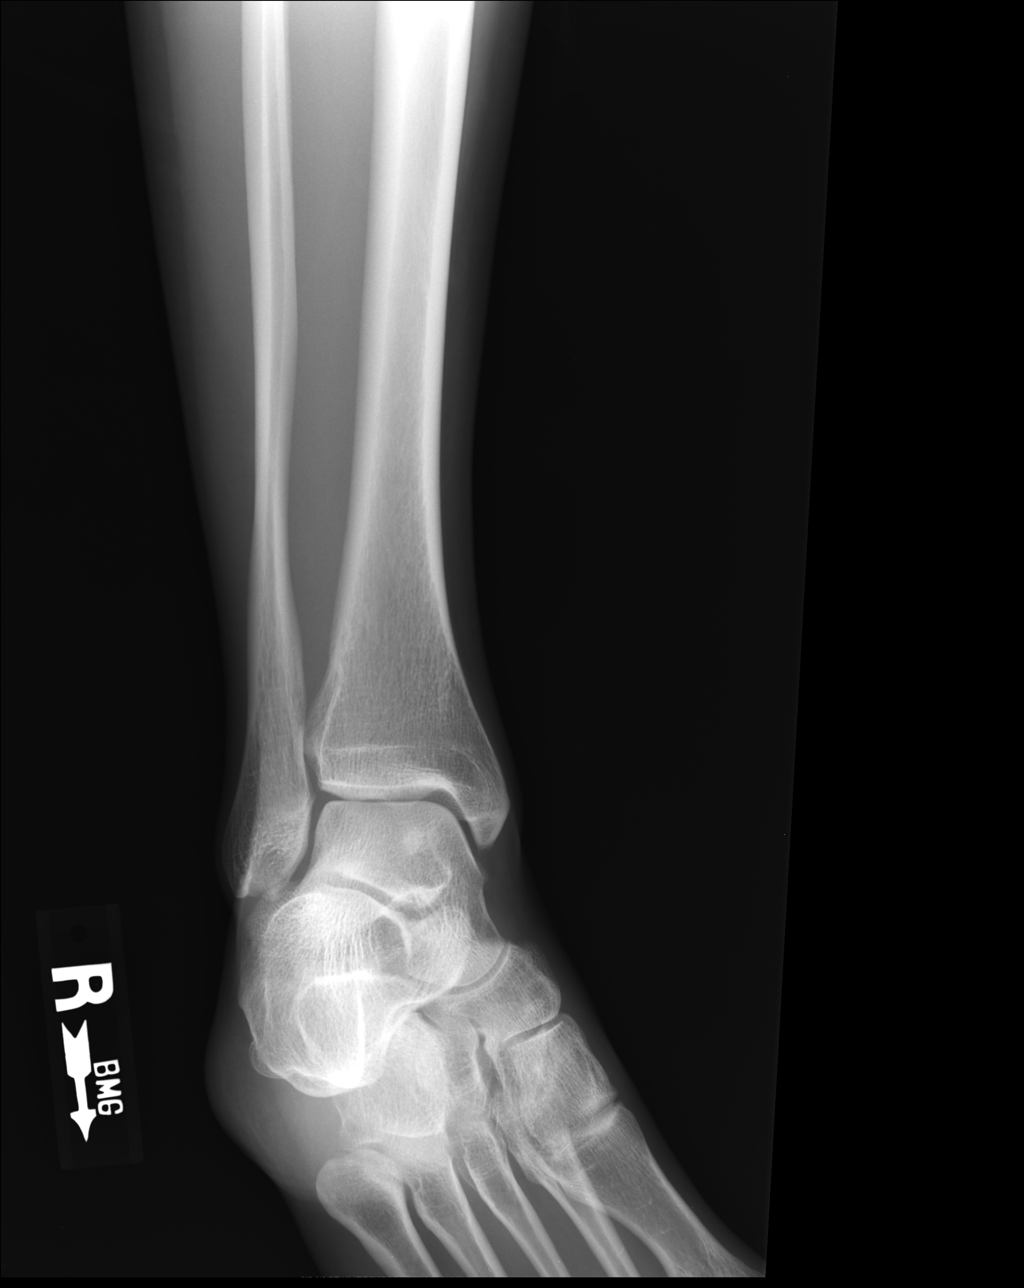

[lateral]
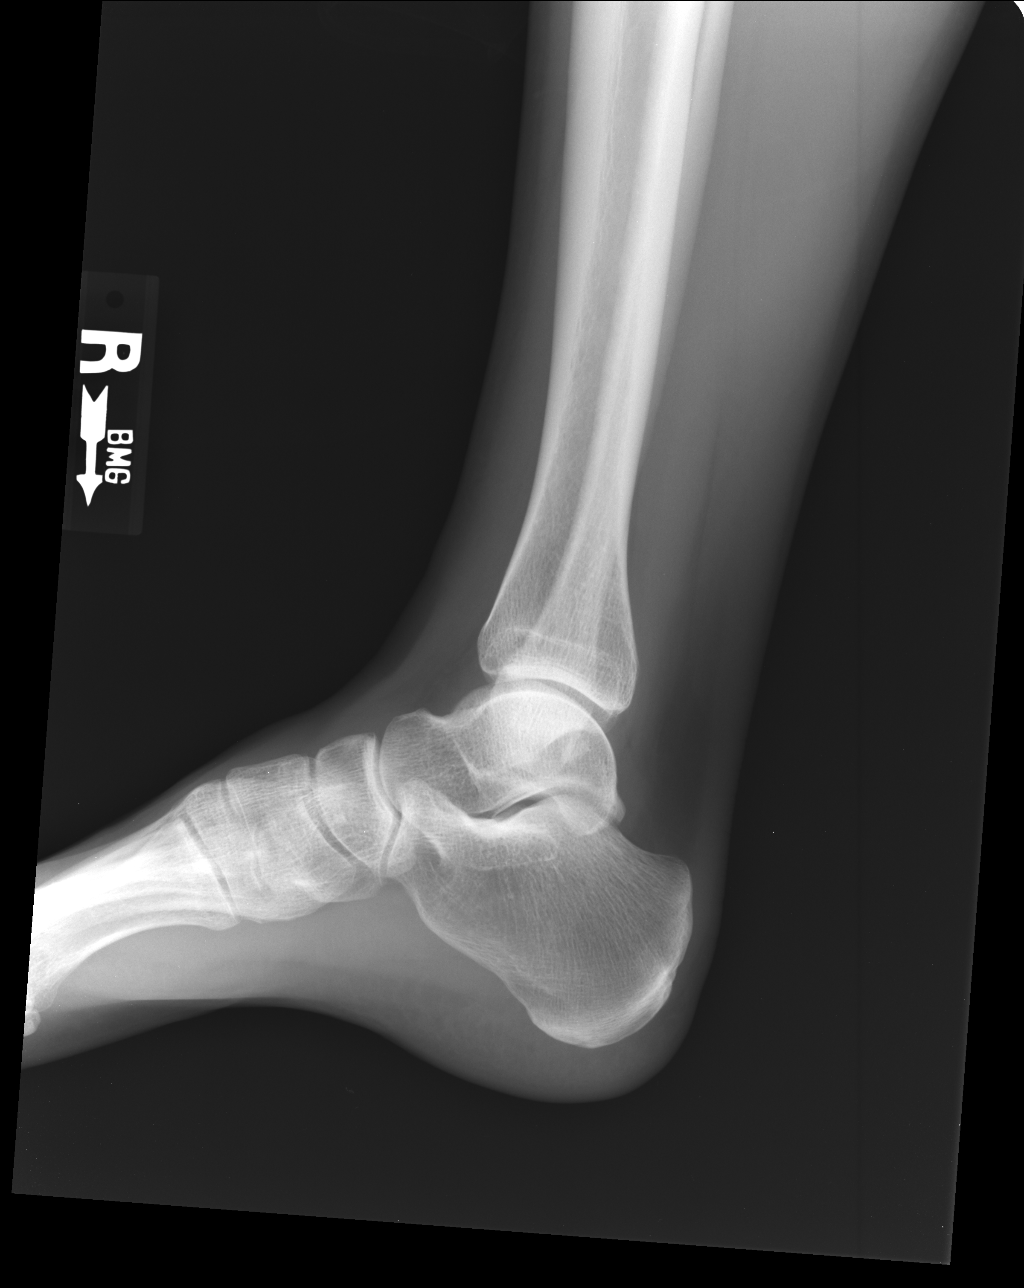

[4 of 4 positions shown; findings below may reference images not displayed]

FINDINGS: There is no evidence of fracture, dislocation, or joint effusion.
There is no evidence of arthropathy or other focal bone abnormality.
Soft tissues are unremarkable.
IMPRESSION: No fracture or dislocation.

## 2017-11-08 ENCOUNTER — Encounter: Payer: Self-pay | Admitting: Family Medicine

## 2017-11-09 ENCOUNTER — Encounter: Payer: Self-pay | Admitting: Emergency Medicine

## 2017-11-10 ENCOUNTER — Encounter: Payer: Self-pay | Admitting: Family Medicine

## 2017-11-10 ENCOUNTER — Ambulatory Visit: Payer: BLUE CROSS/BLUE SHIELD | Admitting: Family Medicine

## 2017-11-10 VITALS — BP 110/70 | HR 103 | Temp 98.2°F | Ht 65.0 in | Wt 101.6 lb

## 2017-11-10 DIAGNOSIS — G44229 Chronic tension-type headache, not intractable: Secondary | ICD-10-CM

## 2017-11-10 DIAGNOSIS — F5101 Primary insomnia: Secondary | ICD-10-CM

## 2017-11-10 DIAGNOSIS — F331 Major depressive disorder, recurrent, moderate: Secondary | ICD-10-CM | POA: Diagnosis not present

## 2017-11-10 DIAGNOSIS — Z23 Encounter for immunization: Secondary | ICD-10-CM

## 2017-11-10 DIAGNOSIS — F988 Other specified behavioral and emotional disorders with onset usually occurring in childhood and adolescence: Secondary | ICD-10-CM

## 2017-11-10 DIAGNOSIS — F411 Generalized anxiety disorder: Secondary | ICD-10-CM

## 2017-11-10 DIAGNOSIS — G4700 Insomnia, unspecified: Secondary | ICD-10-CM | POA: Insufficient documentation

## 2017-11-10 MED ORDER — AMPHETAMINE-DEXTROAMPHETAMINE 20 MG PO TABS: 20.0000 mg | ORAL_TABLET | Freq: Three times a day (TID) | ORAL | 0 refills | Status: DC

## 2017-11-10 MED ORDER — CLONAZEPAM 1 MG PO TABS: 2.0000 mg | ORAL_TABLET | Freq: Every evening | ORAL | 0 refills | Status: DC | PRN

## 2017-11-10 MED ORDER — TRAZODONE HCL 100 MG PO TABS: 100.0000 mg | ORAL_TABLET | Freq: Every day | ORAL | 3 refills | Status: DC

## 2017-11-10 NOTE — Progress Notes (Signed)
Subjective  CC:  Chief Complaint  Patient presents with  . Establish Care    Transfer from [facility], last physical 01/24/2016  . Menstrual Problem    Patient states that her periods are heavy, and pronlonged bleeding  . ADHD    HPI: Norma Elliott is a 37 y.o. female who presents to [facility] today to establish care with me as a new patient.  I reviewed patient's records from the PMP aware controlled substance registry today.  I reviewed records from prior PCP, [name] She has the following concerns or needs:  37 year old female who has been seeing her primary care doctor for multiple years, however she moved to [place] 5 years ago and has been making a 1 hour commute.  This is the reason for her change.  She has completed records regarding her past medical history.  She has suffered from anxiety disorder, chronic severe insomnia, headaches and anxiety disorder.  She recently is seeing a therapist over difficulty handling the loss of her beloved pet recently.  Insomnia: Reports having seen a sleep specialist multiple years ago.  Currently treated with trazodone and Klonopin 2 mg nightly.  She needs refills  ADD: Had been crippling in the past.  Now taking Adderall 3 times daily.  She reports this has made her life much better.  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  She needs refills of this medication.  We did discuss that it is a controlled substance.  I reviewed her drug database registry and it is appropriate.  She has not had a urine drug screen: She says she cannot afford this.  She denies problem with sleep due to her ADD medicines.  She has not had a physical.  Assessment  1. GAD (generalized anxiety disorder)   2. Attention deficit disorder, unspecified hyperactivity presence   3. Chronic tension-type headache, not intractable   4. Moderate episode of recurrent major  depressive disorder (HCC)   5. Need for immunization against influenza   6. Primary insomnia      Plan   General anxiety disorder, ADD and insomnia: Reportedly very well controlled on her current medications.  She and her prior PCP had worked multiple years to try to get medications working well for her.  She does not do well on SSRIs.  She has been to psychiatry in the past.  She reports a diagnosis of general anxiety disorder.  I refilled her medications.  A controlled substance agreement was signed.  She did have trouble with some of the wording.  We discussed the need for urine drug screen.  She cannot afford.  We will discussed with my Production designer, theatre/television/film.  I did discuss that this is the current law for proper management of chronic controlled substances.  Flu shot updated today.  Recommend complete physical.  Follow up:  Return in about 3 months (around 02/10/2018) for follow up on ADD. Orders Placed This Encounter  Procedures  . Flu Vaccine QUAD 36+ mos IM   Meds ordered this encounter  Medications  . DISCONTD: amphetamine-dextroamphetamine (ADDERALL) 20 MG tablet    Sig: Take 1 tablet (20 mg total) by mouth 3 (three) times daily.    Dispense:  90 tablet    Refill:  0  . DISCONTD: clonazePAM (KLONOPIN) 1 MG tablet    Sig: Take 2 tablets (2 mg total) by mouth at bedtime as needed. For  insomnia    Dispense:  60 tablet    Refill:  0  . DISCONTD: amphetamine-dextroamphetamine (ADDERALL) 20 MG tablet    Sig: Take 1 tablet (20 mg total) by mouth 3 (three) times daily.    Dispense:  90 tablet    Refill:  0  . DISCONTD: clonazePAM (KLONOPIN) 1 MG tablet    Sig: Take 2 tablets (2 mg total) by mouth at bedtime as needed. For insomnia    Dispense:  60 tablet    Refill:  0  . amphetamine-dextroamphetamine (ADDERALL) 20 MG tablet    Sig: Take 1 tablet (20 mg total) by mouth 3 (three) times daily.    Dispense:  90 tablet    Refill:  0  . clonazePAM (KLONOPIN) 1 MG tablet    Sig: Take 2 tablets (2  mg total) by mouth at bedtime as needed. For insomnia    Dispense:  60 tablet    Refill:  0  . traZODone (DESYREL) 100 MG tablet    Sig: Take 1 tablet (100 mg total) by mouth at bedtime.    Dispense:  90 tablet    Refill:  3     Depression screen [facility] 2/9 11/10/2017 08/15/2014  Decreased Interest 0 0  Down, Depressed, Hopeless 0 0  PHQ - 2 Score 0 0  Altered sleeping 2 -  Tired, decreased energy 1 -  Change in appetite 3 -  Feeling bad or failure about yourself  0 -  Trouble concentrating 1 -  Moving slowly or fidgety/restless 1 -  Suicidal thoughts 0 -  PHQ-9 Score 8 -  Difficult doing work/chores Somewhat difficult -    We updated and reviewed the patient's past history in detail and it is documented below.  Patient Active Problem List   Diagnosis Date Noted  . Insomnia 11/10/2017    Takes clonazepam only at bedtime for very difficult sleep onset   . Moderate episode of recurrent major depressive disorder (HCC) 02/28/2014  . ADD (attention deficit disorder) 11/20/2013    Stable on relatively high-dose Adderall Needs registry and drug screen   . Tension type headache 06/02/2012  . 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 INFLUENZA VACCINE  08/27/2017  . PAP SMEAR  01/28/2020  . TETANUS/TDAP  01/18/2025  . HIV Screening  Completed   Immunization History  Administered Date(s) Administered  . Hepatitis A, Ped/Adol-2 Dose 09/21/2006, 11/04/2006  . Hepatitis B 09/21/2006, 11/04/2006  . Hepatitis B, ped/adol 09/21/2006, 11/04/2006  . Influenza Split 11/29/2014  . Influenza, Seasonal, Injecte, Preservative Fre 10/19/2012, 11/20/2013  . Influenza,inj,Quad PF,6+ Mos 10/19/2012, 11/20/2013, 11/10/2017  . Tdap 01/19/2015   Current Meds  Medication Sig  . [START ON 01/22/2018] amphetamine-dextroamphetamine (ADDERALL) 20 MG tablet Take 1 tablet (20 mg total) by mouth 3 (three) times daily.  .  butalbital-acetaminophen-caffeine (FIORICET WITH CODEINE) 50-325-40-30 MG per capsule Take 1-2 capsules by mouth every 4 (four) hours as needed for headache.  Melene Muller ON 01/22/2018] clonazePAM (KLONOPIN) 1 MG tablet Take 2 tablets (2 mg total) by mouth at bedtime as needed. For insomnia  . cyclobenzaprine (FLEXERIL) 10 MG tablet Take 10 mg by mouth 3 (three) times daily as needed (Headaches).   . Norgestimate-Ethinyl Estradiol Triphasic 0.18/0.215/0.25 MG-25 MCG tab Take by mouth.  . traZODone (DESYREL) 100 MG tablet Take 1 tablet (100 mg total) by mouth at bedtime.  . [DISCONTINUED] amphetamine-dextroamphetamine (  ADDERALL) 20 MG tablet Take 20 mg by mouth 3 (three) times daily.   . [DISCONTINUED] amphetamine-dextroamphetamine (ADDERALL) 20 MG tablet Take 1 tablet (20 mg total) by mouth 3 (three) times daily.  . [DISCONTINUED] amphetamine-dextroamphetamine (ADDERALL) 20 MG tablet Take 1 tablet (20 mg total) by mouth 3 (three) times daily.  . [DISCONTINUED] clonazePAM (KLONOPIN) 1 MG tablet Take 2 mg by mouth at bedtime as needed. For insomnia  . [DISCONTINUED] clonazePAM (KLONOPIN) 1 MG tablet Take 2 tablets (2 mg total) by mouth at bedtime as needed. For insomnia  . [DISCONTINUED] clonazePAM (KLONOPIN) 1 MG tablet Take 2 tablets (2 mg total) by mouth at bedtime as needed. For insomnia  . [DISCONTINUED] traZODone (DESYREL) 100 MG tablet Take 100 mg by mouth at bedtime.     Allergies: Patient is allergic to sertraline hcl; erythromycin; and other. Past Medical History Patient  has a past medical history of ADD (attention deficit disorder), Depression, Esophageal mass, History of fainting spells of unknown cause, Hyperlipemia, Insomnia, Migraines, and Tension headache. Past Surgical History Patient  has a past surgical history that includes Appendectomy. Family History: Patient family history includes Arthritis in her maternal grandmother and mother; COPD in her father; Cancer in her mother;  Depression in her maternal grandmother; Diabetes in her paternal grandmother; Hearing loss in her father, maternal grandmother, mother, and paternal grandfather; Heart attack in her maternal grandfather; Heart disease in her maternal grandfather, mother, and paternal grandfather; Hyperlipidemia in her father, maternal grandfather, and mother; Hypertension in her maternal grandfather; Kidney disease in her paternal grandmother; Mental retardation in her maternal grandmother; Stroke in her maternal grandfather. Social History:  Patient  reports that she has quit smoking. Her smoking use included e-cigarettes. She has never used smokeless tobacco. She reports that she drinks about 12.0 standard drinks of alcohol per week. She reports that she does not use drugs.  Review of Systems: Constitutional: negative for fever or malaise Ophthalmic: negative for photophobia, double vision or loss of vision Cardiovascular: negative for chest pain, dyspnea on exertion, or new LE swelling Respiratory: negative for SOB or persistent cough Gastrointestinal: negative for abdominal pain, change in bowel habits or melena Genitourinary: negative for dysuria or gross hematuria Musculoskeletal: negative for new gait disturbance or muscular weakness Integumentary: negative for new or persistent rashes Neurological: negative for TIA or stroke symptoms Psychiatric: negative for SI or delusions Allergic/Immunologic: negative for hives  Patient Care Team    Relationship Specialty Notifications Start End  Willow Ora, MD PCP - General Family Medicine  11/10/17     Objective  Vitals: BP 110/70   Pulse (!) 103   Temp 98.2 F (36.8 C)   Ht 5\' 5"  (1.651 m)   Wt 101 lb 9.6 oz (46.1 kg)   BMI 16.91 kg/m  General:  Well developed, well nourished, no acute distress  Psych:  Alert and oriented,normal mood and affect, she is emotional when speaking about her dog.  Normal speech. HEENT:  Normocephalic, atraumatic,  non-icteric sclera, PERRL, Cardiovascular:  RRR without gallop, rub or murmur, nondisplaced PMI Respiratory:  Good breath sounds bilaterally, CTAB with normal respiratory effort MSK: no deformities, contusions. Joints are without erythema or swelling Skin:  Warm, no rashes or suspicious lesions noted Neurologic:    Mental status is normal. Gross motor and sensory exams are normal. Normal gait   Commons side effects, risks, benefits, and alternatives for medications and treatment plan prescribed today were discussed, and the patient expressed understanding of the given instructions.  Patient is instructed to call or message via MyChart if he/she has any questions or concerns regarding our treatment plan. No barriers to understanding were identified. We discussed Red Flag symptoms and signs in detail. Patient expressed understanding regarding what to do in case of urgent or emergency type symptoms.   Medication list was reconciled, printed and provided to the patient in AVS. Patient instructions and summary information was reviewed with the patient as documented in the AVS. This note was prepared with assistance of Dragon voice recognition software. Occasional wrong-word or sound-a-like substitutions may have occurred due to the inherent limitations of voice recognition software

## 2017-11-10 NOTE — Patient Instructions (Signed)
Please return in 3 months for ADD follow up.   It was a pleasure meeting you today! Thank you for choosing [place] to meet your healthcare needs! I truly look forward to working with you. If you have any questions or concerns, please send me a message via Mychart or call the office at [phone].

## 2018-02-12 ENCOUNTER — Ambulatory Visit: Payer: BLUE CROSS/BLUE SHIELD | Admitting: Family Medicine

## 2019-11-16 ENCOUNTER — Other Ambulatory Visit: Payer: Self-pay

## 2019-11-16 ENCOUNTER — Emergency Department (HOSPITAL_COMMUNITY)
Admission: EM | Admit: 2019-11-16 | Discharge: 2019-11-17 | Disposition: A | Discharge status: Home / Self Care | Payer: 59 | Attending: Emergency Medicine | Admitting: Emergency Medicine

## 2019-11-16 ENCOUNTER — Encounter (HOSPITAL_COMMUNITY): Payer: Self-pay

## 2019-11-16 DIAGNOSIS — T1491XA Suicide attempt, initial encounter: Secondary | ICD-10-CM | POA: Insufficient documentation

## 2019-11-16 DIAGNOSIS — F332 Major depressive disorder, recurrent severe without psychotic features: Secondary | ICD-10-CM | POA: Insufficient documentation

## 2019-11-16 DIAGNOSIS — Z87891 Personal history of nicotine dependence: Secondary | ICD-10-CM | POA: Diagnosis not present

## 2019-11-16 DIAGNOSIS — T50902A Poisoning by unspecified drugs, medicaments and biological substances, intentional self-harm, initial encounter: Secondary | ICD-10-CM | POA: Diagnosis not present

## 2019-11-16 DIAGNOSIS — X838XXA Intentional self-harm by other specified means, initial encounter: Secondary | ICD-10-CM | POA: Insufficient documentation

## 2019-11-16 DIAGNOSIS — Z20822 Contact with and (suspected) exposure to covid-19: Secondary | ICD-10-CM | POA: Insufficient documentation

## 2019-11-16 LAB — CBC WITH DIFFERENTIAL/PLATELET
Abs Immature Granulocytes: 0.02 10*3/uL (ref 0.00–0.07)
Basophils Absolute: 0 10*3/uL (ref 0.0–0.1)
Basophils Relative: 1 %
Eosinophils Absolute: 0.4 10*3/uL (ref 0.0–0.5)
Eosinophils Relative: 8 %
HCT: 40.4 % (ref 36.0–46.0)
Hemoglobin: 13.6 g/dL (ref 12.0–15.0)
Immature Granulocytes: 0 %
Lymphocytes Relative: 30 %
Lymphs Abs: 1.6 10*3/uL (ref 0.7–4.0)
MCH: 31.7 pg (ref 26.0–34.0)
MCHC: 33.7 g/dL (ref 30.0–36.0)
MCV: 94.2 fL (ref 80.0–100.0)
Monocytes Absolute: 0.3 10*3/uL (ref 0.1–1.0)
Monocytes Relative: 6 %
Neutro Abs: 2.9 10*3/uL (ref 1.7–7.7)
Neutrophils Relative %: 55 %
Platelets: 314 10*3/uL (ref 150–400)
RBC: 4.29 MIL/uL (ref 3.87–5.11)
RDW: 14.6 % (ref 11.5–15.5)
WBC: 5.2 10*3/uL (ref 4.0–10.5)
nRBC: 0 % (ref 0.0–0.2)

## 2019-11-16 LAB — COMPREHENSIVE METABOLIC PANEL
ALT: 12 U/L (ref 0–44)
AST: 15 U/L (ref 15–41)
Albumin: 4.4 g/dL (ref 3.5–5.0)
Alkaline Phosphatase: 68 U/L (ref 38–126)
Anion gap: 13 (ref 5–15)
BUN: 8 mg/dL (ref 6–20)
CO2: 26 mmol/L (ref 22–32)
Calcium: 8.8 mg/dL — ABNORMAL LOW (ref 8.9–10.3)
Chloride: 104 mmol/L (ref 98–111)
Creatinine, Ser: 0.76 mg/dL (ref 0.44–1.00)
GFR, Estimated: >60 mL/min (ref >60)
Glucose, Bld: 93 mg/dL (ref 70–99)
Potassium: 3.9 mmol/L (ref 3.5–5.1)
Sodium: 143 mmol/L (ref 135–145)
Total Bilirubin: 0.3 mg/dL (ref 0.3–1.2)
Total Protein: 7.3 g/dL (ref 6.5–8.1)

## 2019-11-16 LAB — ETHANOL: Alcohol, Ethyl (B): 242 mg/dL — ABNORMAL HIGH (ref <10)

## 2019-11-16 LAB — SALICYLATE LEVEL: Salicylate Lvl: <7 mg/dL — ABNORMAL LOW (ref 7.0–30.0)

## 2019-11-16 LAB — I-STAT BETA HCG BLOOD, ED (MC, WL, AP ONLY): I-stat hCG, quantitative: <5 m[IU]/mL (ref <5)

## 2019-11-16 LAB — ACETAMINOPHEN LEVEL: Acetaminophen (Tylenol), Serum: <10 ug/mL — ABNORMAL LOW (ref 10–30)

## 2019-11-16 NOTE — ED Provider Notes (Addendum)
[facility]-EMERGENCY DEPT Provider Note   CSN: [id number] Arrival date & time: 11/16/19  2240     History Chief Complaint  Patient presents with  . Suicidal  . Drug Overdose    Norma Elliott is a 39 y.o. female.  The history is provided by the patient and medical records.  Drug Overdose    39 year old female with history of ADD, depression, hyperlipidemia, insomnia, migraine headaches, presenting to the ED after intentional overdose.  Patient states she has had some deaths in the family recently and she has been very depressed.  States she feels helpless and like no one is available to help her.  States she has been suffering from insomnia for quite some time and has been prescribed Klonopin for sleep.  States today she got very overwhelmed, took a handful of the Klonopin, unsure exactly how many, and drank 2 bottles of wine.  She does admit she was trying to hurt herself.  She denies any homicidal ideation.  No hallucinations.  Patient states she just feels very tired right now.  Past Medical History:  Diagnosis Date  . ADD (attention deficit disorder)   . Depression   . Esophageal mass   . History of fainting spells of unknown cause   . Hyperlipemia   . Insomnia   . Migraines   . Tension headache     Patient Active Problem List   Diagnosis Date Noted  . Insomnia 11/10/2017  . Moderate episode of recurrent major depressive disorder (HCC) 02/28/2014  . ADD (attention deficit disorder) 11/20/2013  . Tension type headache 06/02/2012  . 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 APPENDECTOMY       OB History   No obstetric history on file.     Family History  Problem Relation Age of Onset  . Cancer Mother   . Arthritis Mother   . Hearing loss Mother   . Hyperlipidemia Mother   . Heart disease Mother   . COPD Father   . Hearing loss Father   . Hyperlipidemia Father   . Arthritis Maternal  Grandmother   . Depression Maternal Grandmother   . Hearing loss Maternal Grandmother   . Mental retardation Maternal Grandmother   . Hyperlipidemia Maternal Grandfather   . Hypertension Maternal Grandfather   . Heart disease Maternal Grandfather   . Heart attack Maternal Grandfather   . Stroke Maternal Grandfather   . Diabetes Paternal Grandmother   . Kidney disease Paternal Grandmother   . Hearing loss Paternal Grandfather   . Heart disease Paternal Grandfather     Social History   Tobacco Use  . Smoking status: Former Smoker    Types: E-cigarettes  . Smokeless tobacco: Never Used  Vaping Use  . Vaping Use: Former  Substance Use Topics  . Alcohol use: Yes    Alcohol/week: 12.0 standard drinks    Types: 10 Glasses of wine, 2 Standard drinks or equivalent per week    Comment: socially  . Drug use: No    Home Medications Prior to Admission medications   Medication Sig Start Date End Date Taking? Authorizing Provider  amphetamine-dextroamphetamine (ADDERALL) 20 MG tablet Take 1 tablet (20 mg total) by mouth 3 (three) times daily. 01/22/18   Willow Ora, MD  butalbital-acetaminophen-caffeine (FIORICET WITH CODEINE) [phone] MG per capsule Take 1-2 capsules by mouth every 4 (four) hours as needed for headache.    [provider]  clonazePAM Scarlette Calico) 1  MG tablet Take 2 tablets (2 mg total) by mouth at bedtime as needed. For insomnia 01/22/18   Willow Ora, MD  cyclobenzaprine (FLEXERIL) 10 MG tablet Take 10 mg by mouth 3 (three) times daily as needed (Headaches).  03/09/13   [provider]  Norgestimate-Ethinyl Estradiol Triphasic 0.18/0.215/0.25 MG-25 MCG tab Take by mouth. 02/28/17 02/28/18  [provider]  traZODone (DESYREL) 100 MG tablet Take 1 tablet (100 mg total) by mouth at bedtime. 11/10/17   Willow Ora, MD    Allergies    Sertraline hcl, Erythromycin, and Other  Review of Systems   Review of Systems    Psychiatric/Behavioral: Positive for suicidal ideas.  All other systems reviewed and are negative.   Physical Exam Updated Vital Signs BP 114/84 (BP Location: Right Arm)   Pulse 93   Temp 98.4 F (36.9 C) (Oral)   Resp 16   LMP 11/09/2019   SpO2 100%   Physical Exam Vitals and nursing note reviewed.  Constitutional:      Appearance: She is well-developed.     Comments: intoxicated  HENT:     Head: Normocephalic and atraumatic.  Eyes:     Conjunctiva/sclera: Conjunctivae normal.     Pupils: Pupils are equal, round, and reactive to light.  Cardiovascular:     Rate and Rhythm: Normal rate and regular rhythm.     Heart sounds: Normal heart sounds.  Pulmonary:     Effort: Pulmonary effort is normal. No respiratory distress.     Breath sounds: Normal breath sounds. No rhonchi.  Abdominal:     General: Bowel sounds are normal.     Palpations: Abdomen is soft.     Tenderness: There is no abdominal tenderness. There is no rebound.  Musculoskeletal:        General: Normal range of motion.     Cervical back: Normal range of motion.  Skin:    General: Skin is warm and dry.  Neurological:     Mental Status: She is alert and oriented to person, place, and time.  Psychiatric:        Thought Content: Thought content includes suicidal ideation. Thought content includes suicidal plan.     ED Results / Procedures / Treatments   Labs (all labs ordered are listed, but only abnormal results are displayed) Labs Reviewed  COMPREHENSIVE METABOLIC PANEL - Abnormal; Notable for the following components:      Result Value   Calcium 8.8 (*)    All other components within normal limits  ETHANOL - Abnormal; Notable for the following components:   Alcohol, Ethyl (B) 242 (*)    All other components within normal limits  RAPID URINE DRUG SCREEN, HOSP PERFORMED - Abnormal; Notable for the following components:   Amphetamines POSITIVE (*)    Tetrahydrocannabinol POSITIVE (*)    All other  components within normal limits  SALICYLATE LEVEL - Abnormal; Notable for the following components:   Salicylate Lvl <7.0 (*)    All other components within normal limits  ACETAMINOPHEN LEVEL - Abnormal; Notable for the following components:   Acetaminophen (Tylenol), Serum <10 (*)    All other components within normal limits  RESPIRATORY PANEL BY RT PCR (FLU A&B, COVID)  CBC WITH DIFFERENTIAL/PLATELET  I-STAT BETA HCG BLOOD, ED ([facility], [facility], [facility] ONLY)  CBG MONITORING, ED  CBG MONITORING, ED    EKG None  Radiology No results found.  Procedures Procedures (including critical care time)  Medications Ordered in ED Medications - No  data to display  ED Course  I have reviewed the triage vital signs and the nursing notes.  Pertinent labs & imaging results that were available during my care of the patient were reviewed by me and considered in my medical decision making (see chart for details).    MDM Rules/Calculators/A&P  39 year old female presenting to the ED following suicide attempt.  She states she took a handful of Klonopin, unsure of quantity or dosage, and drank 2 bottles of wine.  States there is a lot of stress in her family and some recent deaths.  States she feels helpless and like no one cares about her.  She is intoxicated, but conversant.  She is asking for help.  Labs and EKG pending.  11:46 PM Patient making a scene in hallway, yelling, screaming, wanting to go home for her dog.  I have explained to her that given her actions earlier today, she is not safe to be discharged.  She will need medical monitoring and psychiatric evaluation.  I have informed her that if she attempts to leave, will place under IVC.  She does not want that, decided to lay back down in bed.  Will monitor.  Labs as above-- UDS + for amphetamines and THC.  Ethanol 242.  Patient is ambulatory and continues answering questions.  She is much more cooperative at this time.  Will get TTS consult.  TTS  has evaluated, recommends IP treatment.  Patient was informed of psychiatry recommendations and is somewhat unhappy about this as she reports her dog is home alone.  RN states [employer] actually went to the house to check on her dog about an hour ago.  IVC paperwork has been prepared as I anticipate patient will try to leave.  Final Clinical Impression(s) / ED Diagnoses Final diagnoses:  Suicide attempt [facility])    Rx / DC Orders ED Discharge Orders    None       Garlon Hatchet, PA-C 11/17/19 0553    Garlon Hatchet, PA-C 11/17/19 [id number]    Paula Libra, MD 11/17/19 [phone]

## 2019-11-16 NOTE — ED Notes (Signed)
Pt. Brought in by [employer]. pt. Has 1 pr. Flip flops and 1 black purse with key on the side. Pt. Belongings behind nurses station between 16-18.

## 2019-11-16 NOTE — ED Triage Notes (Signed)
Pt took an unknown amount of klonopin and drank 2 bottles of wine with the intention of harming herself Pt has a hx of the same

## 2019-11-17 ENCOUNTER — Inpatient Hospital Stay (HOSPITAL_COMMUNITY)
Admission: RE | Admit: 2019-11-17 | Discharge: 2019-11-18 | DRG: 885 | Disposition: A | Discharge status: Home / Self Care | Payer: 59 | Source: Intra-hospital | Attending: Psychiatry | Admitting: Psychiatry

## 2019-11-17 ENCOUNTER — Encounter (HOSPITAL_COMMUNITY): Payer: Self-pay | Admitting: Nurse Practitioner

## 2019-11-17 DIAGNOSIS — E785 Hyperlipidemia, unspecified: Secondary | ICD-10-CM | POA: Diagnosis present

## 2019-11-17 DIAGNOSIS — F1024 Alcohol dependence with alcohol-induced mood disorder: Secondary | ICD-10-CM | POA: Diagnosis present

## 2019-11-17 DIAGNOSIS — J9859 Other diseases of mediastinum, not elsewhere classified: Secondary | ICD-10-CM | POA: Diagnosis present

## 2019-11-17 DIAGNOSIS — G44209 Tension-type headache, unspecified, not intractable: Secondary | ICD-10-CM | POA: Diagnosis present

## 2019-11-17 DIAGNOSIS — Z8249 Family history of ischemic heart disease and other diseases of the circulatory system: Secondary | ICD-10-CM

## 2019-11-17 DIAGNOSIS — F1094 Alcohol use, unspecified with alcohol-induced mood disorder: Secondary | ICD-10-CM | POA: Diagnosis not present

## 2019-11-17 DIAGNOSIS — F17213 Nicotine dependence, cigarettes, with withdrawal: Secondary | ICD-10-CM | POA: Diagnosis not present

## 2019-11-17 DIAGNOSIS — F1210 Cannabis abuse, uncomplicated: Secondary | ICD-10-CM | POA: Diagnosis present

## 2019-11-17 DIAGNOSIS — Z8261 Family history of arthritis: Secondary | ICD-10-CM | POA: Diagnosis not present

## 2019-11-17 DIAGNOSIS — F4310 Post-traumatic stress disorder, unspecified: Secondary | ICD-10-CM | POA: Diagnosis present

## 2019-11-17 DIAGNOSIS — Z9151 Personal history of suicidal behavior: Secondary | ICD-10-CM

## 2019-11-17 DIAGNOSIS — F332 Major depressive disorder, recurrent severe without psychotic features: Principal | ICD-10-CM | POA: Diagnosis present

## 2019-11-17 DIAGNOSIS — Z833 Family history of diabetes mellitus: Secondary | ICD-10-CM | POA: Diagnosis not present

## 2019-11-17 DIAGNOSIS — Z825 Family history of asthma and other chronic lower respiratory diseases: Secondary | ICD-10-CM

## 2019-11-17 DIAGNOSIS — F10220 Alcohol dependence with intoxication, uncomplicated: Secondary | ICD-10-CM | POA: Diagnosis not present

## 2019-11-17 DIAGNOSIS — Z823 Family history of stroke: Secondary | ICD-10-CM

## 2019-11-17 DIAGNOSIS — Z818 Family history of other mental and behavioral disorders: Secondary | ICD-10-CM | POA: Diagnosis not present

## 2019-11-17 DIAGNOSIS — G4700 Insomnia, unspecified: Secondary | ICD-10-CM | POA: Diagnosis present

## 2019-11-17 DIAGNOSIS — F5105 Insomnia due to other mental disorder: Secondary | ICD-10-CM | POA: Diagnosis present

## 2019-11-17 DIAGNOSIS — Z83438 Family history of other disorder of lipoprotein metabolism and other lipidemia: Secondary | ICD-10-CM | POA: Diagnosis not present

## 2019-11-17 DIAGNOSIS — F17290 Nicotine dependence, other tobacco product, uncomplicated: Secondary | ICD-10-CM | POA: Diagnosis present

## 2019-11-17 DIAGNOSIS — Z91410 Personal history of adult physical and sexual abuse: Secondary | ICD-10-CM

## 2019-11-17 DIAGNOSIS — Z87891 Personal history of nicotine dependence: Secondary | ICD-10-CM | POA: Diagnosis not present

## 2019-11-17 DIAGNOSIS — F17200 Nicotine dependence, unspecified, uncomplicated: Secondary | ICD-10-CM

## 2019-11-17 DIAGNOSIS — T50902A Poisoning by unspecified drugs, medicaments and biological substances, intentional self-harm, initial encounter: Secondary | ICD-10-CM | POA: Diagnosis not present

## 2019-11-17 DIAGNOSIS — F988 Other specified behavioral and emotional disorders with onset usually occurring in childhood and adolescence: Secondary | ICD-10-CM | POA: Diagnosis present

## 2019-11-17 DIAGNOSIS — F1020 Alcohol dependence, uncomplicated: Secondary | ICD-10-CM | POA: Diagnosis present

## 2019-11-17 LAB — CBG MONITORING, ED
Glucose-Capillary: 77 mg/dL (ref 70–99)
Glucose-Capillary: 91 mg/dL (ref 70–99)

## 2019-11-17 LAB — RESPIRATORY PANEL BY RT PCR (FLU A&B, COVID)
Influenza A by PCR: NEGATIVE
Influenza B by PCR: NEGATIVE
SARS Coronavirus 2 by RT PCR: NEGATIVE

## 2019-11-17 LAB — RAPID URINE DRUG SCREEN, HOSP PERFORMED
Amphetamines: POSITIVE — AB
Barbiturates: NOT DETECTED
Benzodiazepines: NOT DETECTED
Cocaine: NOT DETECTED
Opiates: NOT DETECTED
Tetrahydrocannabinol: POSITIVE — AB

## 2019-11-17 MED ORDER — HYDROXYZINE HCL 25 MG PO TABS
25.0000 mg | ORAL_TABLET | Freq: Four times a day (QID) | ORAL | Status: DC | PRN
  Filled 2019-11-17: qty 1

## 2019-11-17 MED ORDER — ADULT MULTIVITAMIN W/MINERALS CH
1.0000 | ORAL_TABLET | Freq: Every day | ORAL | Status: DC
  Administered 2019-11-17 – 2019-11-18 (×2): 1 via ORAL
  Filled 2019-11-17 (×4): qty 1

## 2019-11-17 MED ORDER — ONDANSETRON 4 MG PO TBDP: 4.0000 mg | ORAL_TABLET | Freq: Four times a day (QID) | ORAL | Status: DC | PRN

## 2019-11-17 MED ORDER — NICOTINE 21 MG/24HR TD PT24: 21.0000 mg | MEDICATED_PATCH | Freq: Once | TRANSDERMAL | Status: DC

## 2019-11-17 MED ORDER — LORAZEPAM 1 MG PO TABS
1.0000 mg | ORAL_TABLET | Freq: Four times a day (QID) | ORAL | Status: DC | PRN
  Administered 2019-11-17: 1 mg via ORAL
  Filled 2019-11-17: qty 1

## 2019-11-17 MED ORDER — HYDROXYZINE HCL 25 MG PO TABS: 25.0000 mg | ORAL_TABLET | Freq: Three times a day (TID) | ORAL | Status: DC | PRN

## 2019-11-17 MED ORDER — LOPERAMIDE HCL 2 MG PO CAPS: 2.0000 mg | ORAL_CAPSULE | ORAL | Status: DC | PRN

## 2019-11-17 MED ORDER — THIAMINE HCL 100 MG PO TABS
100.0000 mg | ORAL_TABLET | Freq: Every day | ORAL | Status: DC
  Administered 2019-11-18: 100 mg via ORAL
  Filled 2019-11-17 (×3): qty 1

## 2019-11-17 MED ORDER — LORAZEPAM 2 MG/ML IJ SOLN: 1.0000 mg | INTRAMUSCULAR | Status: DC | PRN

## 2019-11-17 MED ORDER — NICOTINE 21 MG/24HR TD PT24
MEDICATED_PATCH | TRANSDERMAL | Status: AC
  Administered 2019-11-17: 21 mg via TRANSDERMAL
  Filled 2019-11-17: qty 1

## 2019-11-17 MED ORDER — TRAZODONE HCL 100 MG PO TABS
100.0000 mg | ORAL_TABLET | Freq: Every evening | ORAL | Status: DC | PRN
  Administered 2019-11-17: 100 mg via ORAL
  Filled 2019-11-17: qty 1

## 2019-11-17 MED ORDER — TRAZODONE HCL 50 MG PO TABS: 50.0000 mg | ORAL_TABLET | Freq: Every evening | ORAL | Status: DC | PRN

## 2019-11-17 MED ORDER — MAGNESIUM HYDROXIDE 400 MG/5ML PO SUSP: 30.0000 mL | Freq: Every day | ORAL | Status: DC | PRN

## 2019-11-17 MED ORDER — NICOTINE 21 MG/24HR TD PT24
21.0000 mg | MEDICATED_PATCH | Freq: Every day | TRANSDERMAL | Status: DC
  Administered 2019-11-17 – 2019-11-18 (×2): 21 mg via TRANSDERMAL
  Filled 2019-11-17 (×5): qty 1

## 2019-11-17 MED ORDER — FOLIC ACID 1 MG PO TABS
1.0000 mg | ORAL_TABLET | Freq: Every day | ORAL | Status: DC
  Administered 2019-11-17 – 2019-11-18 (×2): 1 mg via ORAL
  Filled 2019-11-17 (×5): qty 1

## 2019-11-17 MED ORDER — ALUM & MAG HYDROXIDE-SIMETH 200-200-20 MG/5ML PO SUSP: 30.0000 mL | ORAL | Status: DC | PRN

## 2019-11-17 MED ORDER — ACETAMINOPHEN 325 MG PO TABS: 650.0000 mg | ORAL_TABLET | Freq: Four times a day (QID) | ORAL | Status: DC | PRN

## 2019-11-17 NOTE — Progress Notes (Signed)
Patient ID: Norma Elliott, female   DOB: [birth year], 39 y.o.   MRN: [id number] Patient admitted to [facility] from [facility] hospital under voluntary status for a suicide attempt via overdose on Klonopin. Pt reports a mental health diagnosis of MDD, states that she takes the Klonopin "to help me sleep", and was feeling overwhelmed, so she called the crises hotline, states that they were "not very helpful", so she took "a handful of Klonopin", and drank a bottle of wine. She reports that [employer] showed up to her home after that and transported her to the hospital. Pt reports her stressors as a recent death in her family and the lack of a support system. Pt irritable on arrival to [facility], but was eventually able to regain control, after being given active listening and empathy. Medications offered for anxiety,but she declined.  Pt reports that she does not drink alcohol every day, is in and out of AA meetings, states that she drinks "once in a while", but reports: "I have no control over alcohol and I drink any alcohol, except moonshine, until I black out". Pt reports that the last time that she had a black out after drinking alcohol was last Saturday. BAL was 242 at [facility], and pt reports a history of occasional marijuana use, and denies usinig any other recreational substances. Pt reports having had "traumas" in her childhood and as an adult and refused to elaborate of these. Pt currently denies SI/HI/AVH, educated on unit rules protocols, provided with pt Bill of Rights, Q15 minute checks initiated.

## 2019-11-17 NOTE — ED Notes (Signed)
Attempted to call report to [facility], nurse at [facility] in report at this time.
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
## 2019-11-17 NOTE — BHH Group Notes (Signed)
Occupational Therapy Group Note Date: 11/17/2019 Group Topic/Focus: Communication Skills  Group Description: Group encouraged increased engagement and participation through discussion focused on identifying and 'breaking down barriers." Group members were encouraged to complete a worksheet and engage in discussion identifying barriers that get in the way of seeking treatment, asking for help, and managing our mental health.   Participation Level: Patient did not attend OT group session.  Plan: Continue to engage patient in OT groups 2 - 3x/week.  11/17/2019  Shandell Giovanni, MOT, OTR/L 

## 2019-11-17 NOTE — ED Notes (Signed)
Patient going to [facility] 303-2 per social worker This RN attempted to call report x2
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
## 2019-11-17 NOTE — ED Notes (Signed)
Attempted to call report to [facility], per AJ nurse still in report

## 2019-11-17 NOTE — BH Assessment (Signed)
This therapist informed and was acknowledged by Isaias Sakai, RN of pt's disposition. RN agreed to inform EDP of disposition.    Dolores Frame, MSW, LCSW-A Triage Specialist [phone]

## 2019-11-17 NOTE — ED Notes (Signed)
Attempted to call report to [facility], nurse was in shift report and will call back.
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
## 2019-11-18 LAB — LIPID PANEL
Cholesterol: 211 mg/dL — ABNORMAL HIGH (ref 0–200)
HDL: 80 mg/dL (ref >40)
LDL Cholesterol: 116 mg/dL — ABNORMAL HIGH (ref 0–99)
Total CHOL/HDL Ratio: 2.6 RATIO
Triglycerides: 77 mg/dL (ref <150)
VLDL: 15 mg/dL (ref 0–40)

## 2019-11-18 LAB — TSH: TSH: 1.493 u[IU]/mL (ref 0.350–4.500)

## 2019-11-18 MED ORDER — NICOTINE 21 MG/24HR TD PT24: 21.0000 mg | MEDICATED_PATCH | Freq: Every day | TRANSDERMAL | 0 refills | Status: DC

## 2019-11-18 NOTE — Progress Notes (Signed)
RN met with pt and reviewed pt's discharge instructions.  Pt verbalized understanding of discharge instructions and pt did not have any questions. RN reviewed and provided pt with a copy of SRA, AVS and Transition Record.  RN returned pt's belongings to pt.  printed prescription was given to pt.  Pt denied SI/HI/AVH and voiced no concerns.  Pt was appreciative of the care pt received at [facility]. Patient discharged to the lobby without incident.
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
## 2019-11-18 NOTE — BHH Counselor (Signed)
CSW provided pt with a list of trauma therapist in [place] at the pt's request.   Fredirick Lathe, LCSWA Clinicial Social Worker [employer]

## 2019-11-18 NOTE — Progress Notes (Signed)
Patient was cooperative with treatment. She spent most of the evening in bed .She remains anxious, sad and depressed.She currently in bed resting. She complained to writer on shift about not having her Klonopin and Clinical research associate instructed her to follow up with Dr with her concerns in the morning. She was receptive to taking Ativan and now she appears to be sleep.

## 2019-11-18 NOTE — BHH Counselor (Signed)
Adult Comprehensive Assessment  Patient ID: Norma Elliott, female   DOB: 05-06-1980, 39 y.o.   MRN: [id number]  Information Source:    Current Stressors: CSW was unable to assess due to pt's short duration of stay.     Living/Environment/Situation:  Living Arrangements: Alone  Family History:     Childhood History:     Education:     Employment/Work Situation:      Architect:      Alcohol/Substance Abuse:      Social Support System:      Leisure/Recreation:      Strengths/Needs:      Discharge Plan:      Summary/Recommendations:   Summary and Recommendations (to be completed by the evaluator): Norma Elliott is a 39 y.o female who voluntarily presents to [facility] ED via [facility]. Per Norma Sites, Norma Elliott note, pt has "history of ADD, depression, hyperlipidemia, insomnia, migraine headaches, presenting to the ED after intentional overdose. Patient states she has had some deaths in the family recently and she has been very depressed. States she feels helpless and like no one is available to help her. States she has been suffering from insomnia for quite some time and has been prescribed Klonopin for sleep. States today she got very overwhelmed, took a handful of the Klonopin, unsure exactly how many, and drank 2 bottles of wine. She does admit she was trying to hurt herself. She denies any homicidal ideation. No hallucinations". While here, Norma Elliott can benefit from crisis stabilization, medication management, therapeutic milieu, and referrals for services.  Felizardo Hoffmann. 11/18/2019
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
## 2019-11-18 NOTE — [facility] Suicide Risk Assessment (Signed)
[facility] INPATIENT:  Family/Significant Other Suicide Prevention Education  Suicide Prevention Education:  Patient Refusal for Family/Significant Other Suicide Prevention Education: The patient Norma Elliott, was unable to provide written consent for family/significant other to be provided Family/Significant Other Suicide Prevention Education during admission and/or prior to discharge due to a short duration of stay. Physician notified.  SPE completed with patient, as patient has been unable to provide consent for family contact. SPE pamphlet placed on chart for patient to share with supports at discharge.  Fredirick Lathe, LCSWA Clinicial Social Worker [employer]

## 2019-11-18 NOTE — Progress Notes (Signed)
  [facility] Adult Case Management Discharge Plan :  Will you be returning to the same living situation after discharge:  Yes,  parent's home At discharge, do you have transportation home?: No. CSW will set up safe transport Do you have the ability to pay for your medications: Yes,  has insurance  Release of information consent forms completed and in the chart;  Patient's signature needed at discharge.  Patient to Follow up at:  Follow-up Information    [facility] Follow up.   Specialty: Behavioral Health Contact information: [address] [place] [address] [phone]       [facility] Follow up.   Why: Continue to follow-up with Nettie Elm, therapist. Contact information: [place], [place] [phone]              Next level of care provider has access to [facility] Link:no  Safety Planning and Suicide Prevention discussed: Yes,  w/ pt     Has patient been referred to the Quitline?: Patient refused referral  Patient has been referred for addiction treatment: Pt. refused referral  Chrys Racer 11/18/2019, 9:54 AM

## 2019-11-18 NOTE — Progress Notes (Signed)
Pt did not attend group. 

## 2019-11-19 LAB — HEMOGLOBIN A1C
Hgb A1c MFr Bld: 5.7 % — ABNORMAL HIGH (ref 4.8–5.6)
Mean Plasma Glucose: 117 mg/dL

## 2019-12-15 ENCOUNTER — Telehealth (HOSPITAL_COMMUNITY): Payer: 59 | Admitting: Physician Assistant

## 2020-04-20 ENCOUNTER — Other Ambulatory Visit: Payer: Self-pay

## 2020-04-20 ENCOUNTER — Encounter (HOSPITAL_COMMUNITY): Payer: Self-pay | Admitting: Emergency Medicine

## 2020-04-20 ENCOUNTER — Emergency Department (HOSPITAL_COMMUNITY)
Admission: EM | Admit: 2020-04-20 | Discharge: 2020-04-21 | Disposition: A | Discharge status: Home / Self Care | Payer: 59 | Attending: Emergency Medicine | Admitting: Emergency Medicine

## 2020-04-20 DIAGNOSIS — Z20822 Contact with and (suspected) exposure to covid-19: Secondary | ICD-10-CM | POA: Diagnosis not present

## 2020-04-20 DIAGNOSIS — R45851 Suicidal ideations: Secondary | ICD-10-CM

## 2020-04-20 DIAGNOSIS — Z046 Encounter for general psychiatric examination, requested by authority: Secondary | ICD-10-CM | POA: Insufficient documentation

## 2020-04-20 DIAGNOSIS — F1010 Alcohol abuse, uncomplicated: Secondary | ICD-10-CM | POA: Insufficient documentation

## 2020-04-20 DIAGNOSIS — Z87891 Personal history of nicotine dependence: Secondary | ICD-10-CM | POA: Diagnosis not present

## 2020-04-20 NOTE — H&P (Addendum)
Behavioral Health Medical Screening Exam   Norma Elliott is a 40 y.o. female with a history of depression, alcohol abuse/dependence, and insomnia who presents to [facility] as a voluntary walk-in for worsening depression, SI with a plan, and alcohol abuse. Patient's AA sponsor accompanied her to [facility], but was not present with the patient during the assessment. Patient endorses SI on exam with a plan to overdose on her home medication of Klonopin.  Patient reports that she has been feeling very depressed over the past few months and she states "I am so scared of everything.  I feel very unsafe by myself and I believe that if I go home I am going to try to harm myself". She reports that her most prevalent stressors that are contributing to her depression are that she has multiple family members currently in the hospital and that she fears she will be experiencing some issues with the IRS in the near future. Patient states she has been drinking alcohol continuously for the past 48 hours and has not slept at night for the past 2-3 days. Patient states that she stopped drinking within the past 48 hours sometime between 3 PM - 5 PM today (04/20/20), as well as took 0.5 mg of Klonopin on 2 different occasions today (last took 0.5 mg at 9 PM today).  She states that the last alcoholic beverage she consumed was a 750 mL bottle of vodka, which she finished consuming around 3 PM to 5 PM on 04/20/20. Patient reports that she normally consumes 1-2 "double sized" bottles of wine or >/= 750 mL of vodka daily. 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 Patient endorses history of withdrawal symptoms including tremor, diaphoresis, and nausea/vomiting. Patient reports slight dizziness, slight tremor, and nausea on exam, but denies any diaphoresis, vomiting, or additional physical symptoms at this time. Patient denies any history of seizures, but she does  endorse that she "blacks out" nearly every time that she consumes alcohol.  She states that she "blacked out" sometime this afternoon while drinking alcohol and that she awoke to a phone call.  Patient states that she believes that if she had not woken up to the phone ringing, that she would have died. Patient states that she has been consuming alcohol at this rate of quantity/frequency intermittently since the age of 57.  Patient reports that she was in recovery from drinking alcohol for about 3.5 years in her 45s, but she reports that she has been struggling with alcohol abuse since that time.  Patient also reports that she has been intermittently driving her vehicle under the influence of alcohol. She states that she has been attending AA meetings erratically over the past couple of years and she reports that she will often attend AA meetings while under the influence of alcohol.  Patient denies HI, although she states that she is concerned about developing HI in the future.  She denies AVH or delusions.  Patient endorses 1 past suicide attempt, in which she states she attempted to overdose on Klonopin in October 2021.  Per chart review, patient was admitted to [facility] on November 17, 2019 for a suicide attempt consisting of taking Klonopin (unknown amount) and consuming 2 bottles of wine.  Patient was then discharged from [facility] on November 18, 2019.  Patient denies any additional inpatient psychiatric admissions since this  [facility] admission.  Patient states that she  is not currently seeing a psychiatrist or therapist. She reports that she is prescribed Klonopin 2 mg and trazodone 100 mg p.o. at bedtime for sleep issues.  Patient also states that she was taking Adderall 20 mg for ADHD, but she reports that she stopped taking this about 1-2 months ago. PDMP review shows history of the patient being prescribed Adderall 20 mg and Klonopin 1 mg by the same provider dating back to July 27, 2018, with most recent  prescriptions of Adderall 20 mg and Klonopin 1 mg being written and filled on 03/02/20.  Patient endorses a history of smoking marijuana daily (patient states that she was smoking about 1 ounce of marijuana per month) for the past 2 years, she states that she began smoking marijuana in college.  Patient reports that she has not used marijuana for the past 30 to 45 days.  Patient also reports that she has been smoking 1 pack/day of cigarettes for the past few months.  Aside from Klonopin, patient denies any additional substance use. Patient reports sleeping poorly overall, including not sleeping for the past 2 to 3 days at night. She endorses anhedonia, as well as feelings of guilt, hopelessness, and worthlessness over the past few months. She reports decreased energy and concentration over the past few months as well. Patient lives alone in [place]. She denies any access to guns or weapons.   On exam, patient is sitting in a chair, in no acute distress. Her stated moos is depressed with congruent affect. She is A&O x4, cooperative, and answers all questions appropriately. Patient does not appear to be responding to internal stimuli.   Total Time spent with patient: 30 minutes  Psychiatric Specialty Exam: Physical Exam Vitals reviewed.  Constitutional:      General: She is not in acute distress.    Appearance: She is not ill-appearing, toxic-appearing or diaphoretic.  HENT:     Head: Normocephalic and atraumatic.     Right Ear: External ear normal.     Left Ear: External ear normal.  Cardiovascular:     Rate and Rhythm: Tachycardia present.  Pulmonary:     Effort: Pulmonary effort is normal. No respiratory distress.  Musculoskeletal:        General: Normal range of motion.     Cervical back: Normal range of motion.  Neurological:     Mental Status: She is alert and oriented to person, place, and time.     Comments: Slight tremor noted upon bilateral arm extension.   Psychiatric:         Attention and Perception: She does not perceive auditory or visual hallucinations.        Mood and Affect: Mood is depressed.        Speech: Speech normal.        Behavior: Behavior is not agitated, slowed, aggressive, withdrawn, hyperactive or combative. Behavior is cooperative.        Thought Content: Thought content is not paranoid or delusional. Thought content includes suicidal ideation. Thought content does not include homicidal ideation. Thought content includes suicidal plan.     Comments: Affect is mood congruent. Judgement impaired and insight fair.     Review of Systems  Constitutional: Positive for diaphoresis. Negative for activity change, appetite change, chills, fatigue, fever and unexpected weight change.       Patient endorses history of diaphoresis when she is experiencing alcohol withdrawal, but she denies diaphoresis currently on exam.   HENT: Negative for congestion.   Respiratory: Negative  for cough, chest tightness and shortness of breath.   Gastrointestinal: Positive for nausea and vomiting. Negative for abdominal pain, constipation and diarrhea.       Patient endorses history of N/V when she is experiencing alcohol withdrawal. She endorses nausea on exam, but she denies vomiting currently on exam.   Musculoskeletal: Negative for arthralgias and myalgias.  Neurological: Positive for dizziness and tremors. Negative for seizures, light-headedness and headaches.       Patient endorses history of tremor when she is experiencing alcohol withdrawal. She endorses slight tremor currently on exam. Patient also endorses slight dizziness on exam.   Psychiatric/Behavioral: Positive for decreased concentration, sleep disturbance and suicidal ideas. Negative for agitation, behavioral problems, confusion, hallucinations and self-injury. The patient is nervous/anxious. The patient is not hyperactive.   All other systems reviewed and are negative.    Vitals:  Temp: 98.7 F, BP: 146/99  mmHg (Right Arm), Resp: 16, O2: 100%,  Pulse: 100 bpm   General Appearance: Disheveled Eye Contact:  Good Speech:  Blocked and Normal Rate Volume:  Normal Mood:  Depressed Affect:  Congruent Thought Process:  Coherent, Goal Directed, Linear and Descriptions of Associations: Intact Orientation:  Full (Time, Place, and Person) Thought Content:  Logical Suicidal Thoughts:  Yes.  with intent/plan Homicidal Thoughts:  No Memory:  Immediate;   Fair Recent;   Fair Remote;   Fair Judgement:  Impaired Insight:  Fair Psychomotor Activity:  Normal Concentration: Concentration: Fair and Attention Span: Fair Recall:  [employer] of Knowledge:Fair Language: Good Akathisia:  No Handed:  Right AIMS (if indicated):    Assets:  Communication Skills Desire for Improvement Financial Resources/Insurance Housing Leisure Time Physical Health Resilience Social Support Vocational/Educational Sleep:   Poor   Musculoskeletal: Strength & Muscle Tone: within normal limits Gait & Station: normal Patient leans: N/A   Recommendations: Based on my evaluation, the patient does not appear to have an emergency medical condition, but needs medical clearance at this time.  Patient endorses active SI with a plan to overdose on Klonopin and appears to be experiencing severely worsening depression. Based on patient's history, patient's presentation, and my assessment, believe that patient is a threat to herself at this time and I recommend inpatient psychiatric treatment for the patient. Patient states she has been drinking alcohol continuously for the past 48 hours and has not slept at night for the past 2-3 days. Patient states that she stopped drinking within the past 48 hours sometime between 3 PM - 5 PM today (04/20/20), as well as took 0.5 mg of Klonopin on 2 different occasions today (last took 0.5 mg at 9 PM today). Per Binnie Rail, [facility], patient is accepted to [facility] for inpatient psychiatric  treatment pending medical clearance and negative PCR COVID test. Due to patient's reported recent level of alcohol consumption, will transfer patient to [facility] ED for medical clearance and to check ethanol level prior to admitting her to [facility] for inpatient psychiatric treatment. Provider report given to Dr. Wilkie Aye via phone and Dr. Wilkie Aye has agreed to accept the patient. Dr. Ginette Pitman understanding and agreement of the plan and states that she will notify Dr. Jacqulyn Bath of the plan.   Jaclyn Shaggy, PA-C 04/21/2020, 1:32 AM
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
## 2020-04-21 ENCOUNTER — Inpatient Hospital Stay (HOSPITAL_COMMUNITY)
Admission: RE | Admit: 2020-04-21 | Discharge: 2020-04-23 | DRG: 885 | Disposition: A | Discharge status: Home / Self Care | Payer: 59 | Attending: Psychiatry | Admitting: Psychiatry

## 2020-04-21 ENCOUNTER — Encounter (HOSPITAL_COMMUNITY): Payer: Self-pay | Admitting: Student

## 2020-04-21 ENCOUNTER — Other Ambulatory Visit: Payer: Self-pay

## 2020-04-21 DIAGNOSIS — G4700 Insomnia, unspecified: Secondary | ICD-10-CM | POA: Diagnosis present

## 2020-04-21 DIAGNOSIS — F1024 Alcohol dependence with alcohol-induced mood disorder: Secondary | ICD-10-CM | POA: Diagnosis not present

## 2020-04-21 DIAGNOSIS — R45851 Suicidal ideations: Secondary | ICD-10-CM | POA: Diagnosis present

## 2020-04-21 DIAGNOSIS — Z87891 Personal history of nicotine dependence: Secondary | ICD-10-CM | POA: Diagnosis not present

## 2020-04-21 DIAGNOSIS — Z20822 Contact with and (suspected) exposure to covid-19: Secondary | ICD-10-CM | POA: Diagnosis present

## 2020-04-21 DIAGNOSIS — F10230 Alcohol dependence with withdrawal, uncomplicated: Secondary | ICD-10-CM | POA: Diagnosis not present

## 2020-04-21 DIAGNOSIS — F1994 Other psychoactive substance use, unspecified with psychoactive substance-induced mood disorder: Secondary | ICD-10-CM

## 2020-04-21 DIAGNOSIS — F419 Anxiety disorder, unspecified: Secondary | ICD-10-CM | POA: Diagnosis present

## 2020-04-21 DIAGNOSIS — Z046 Encounter for general psychiatric examination, requested by authority: Secondary | ICD-10-CM | POA: Diagnosis not present

## 2020-04-21 DIAGNOSIS — F332 Major depressive disorder, recurrent severe without psychotic features: Principal | ICD-10-CM | POA: Diagnosis present

## 2020-04-21 LAB — COMPREHENSIVE METABOLIC PANEL
ALT: 13 U/L (ref 0–44)
AST: 17 U/L (ref 15–41)
Albumin: 4.4 g/dL (ref 3.5–5.0)
Alkaline Phosphatase: 78 U/L (ref 38–126)
Anion gap: 10 (ref 5–15)
BUN: 9 mg/dL (ref 6–20)
CO2: 24 mmol/L (ref 22–32)
Calcium: 8.4 mg/dL — ABNORMAL LOW (ref 8.9–10.3)
Chloride: 102 mmol/L (ref 98–111)
Creatinine, Ser: 0.67 mg/dL (ref 0.44–1.00)
GFR, Estimated: >60 mL/min (ref >60)
Glucose, Bld: 105 mg/dL — ABNORMAL HIGH (ref 70–99)
Potassium: 3.5 mmol/L (ref 3.5–5.1)
Sodium: 136 mmol/L (ref 135–145)
Total Bilirubin: 0.7 mg/dL (ref 0.3–1.2)
Total Protein: 7.5 g/dL (ref 6.5–8.1)

## 2020-04-21 LAB — RAPID URINE DRUG SCREEN, HOSP PERFORMED
Amphetamines: NOT DETECTED
Barbiturates: NOT DETECTED
Benzodiazepines: NOT DETECTED
Cocaine: NOT DETECTED
Opiates: NOT DETECTED
Tetrahydrocannabinol: POSITIVE — AB

## 2020-04-21 LAB — I-STAT BETA HCG BLOOD, ED (MC, WL, AP ONLY): I-stat hCG, quantitative: <5 m[IU]/mL (ref <5)

## 2020-04-21 LAB — CBC
HCT: 41 % (ref 36.0–46.0)
Hemoglobin: 13.7 g/dL (ref 12.0–15.0)
MCH: 31.4 pg (ref 26.0–34.0)
MCHC: 33.4 g/dL (ref 30.0–36.0)
MCV: 93.8 fL (ref 80.0–100.0)
Platelets: 308 10*3/uL (ref 150–400)
RBC: 4.37 MIL/uL (ref 3.87–5.11)
RDW: 15.2 % (ref 11.5–15.5)
WBC: 8.2 10*3/uL (ref 4.0–10.5)
nRBC: 0 % (ref 0.0–0.2)

## 2020-04-21 LAB — RESP PANEL BY RT-PCR (FLU A&B, COVID) ARPGX2
Influenza A by PCR: NEGATIVE
Influenza B by PCR: NEGATIVE
SARS Coronavirus 2 by RT PCR: NEGATIVE

## 2020-04-21 LAB — ACETAMINOPHEN LEVEL: Acetaminophen (Tylenol), Serum: <10 ug/mL — ABNORMAL LOW (ref 10–30)

## 2020-04-21 LAB — ETHANOL: Alcohol, Ethyl (B): <10 mg/dL (ref <10)

## 2020-04-21 LAB — SALICYLATE LEVEL: Salicylate Lvl: <7 mg/dL — ABNORMAL LOW (ref 7.0–30.0)

## 2020-04-21 MED ORDER — LORAZEPAM 1 MG PO TABS
0.0000 mg | ORAL_TABLET | Freq: Four times a day (QID) | ORAL | Status: DC
  Administered 2020-04-21: 1 mg via ORAL
  Filled 2020-04-21: qty 1

## 2020-04-21 MED ORDER — TRAZODONE HCL 100 MG PO TABS
100.0000 mg | ORAL_TABLET | Freq: Every evening | ORAL | Status: DC | PRN
  Administered 2020-04-21: 100 mg via ORAL
  Filled 2020-04-21 (×2): qty 1

## 2020-04-21 MED ORDER — NICOTINE 21 MG/24HR TD PT24: 21.0000 mg | MEDICATED_PATCH | Freq: Every day | TRANSDERMAL | Status: DC

## 2020-04-21 MED ORDER — NICOTINE 21 MG/24HR TD PT24
21.0000 mg | MEDICATED_PATCH | Freq: Every day | TRANSDERMAL | Status: DC
  Administered 2020-04-21 – 2020-04-23 (×3): 21 mg via TRANSDERMAL
  Filled 2020-04-21 (×4): qty 1

## 2020-04-21 MED ORDER — LOPERAMIDE HCL 2 MG PO CAPS: 2.0000 mg | ORAL_CAPSULE | ORAL | Status: DC | PRN

## 2020-04-21 MED ORDER — ONDANSETRON 4 MG PO TBDP: 4.0000 mg | ORAL_TABLET | Freq: Four times a day (QID) | ORAL | Status: DC | PRN

## 2020-04-21 MED ORDER — LORAZEPAM 1 MG PO TABS
1.0000 mg | ORAL_TABLET | Freq: Four times a day (QID) | ORAL | Status: DC | PRN
  Administered 2020-04-21: 1 mg via ORAL
  Filled 2020-04-21 (×2): qty 1

## 2020-04-21 MED ORDER — THIAMINE HCL 100 MG PO TABS
100.0000 mg | ORAL_TABLET | Freq: Every day | ORAL | Status: DC
Start: 2020-04-21 — End: 2020-04-23
  Administered 2020-04-22: 100 mg via ORAL
  Filled 2020-04-21 (×5): qty 1

## 2020-04-21 MED ORDER — ALUM & MAG HYDROXIDE-SIMETH 200-200-20 MG/5ML PO SUSP: 30.0000 mL | Freq: Four times a day (QID) | ORAL | Status: DC | PRN

## 2020-04-21 MED ORDER — THIAMINE HCL 100 MG/ML IJ SOLN: 100.0000 mg | Freq: Every day | INTRAMUSCULAR | Status: DC

## 2020-04-21 MED ORDER — ACETAMINOPHEN 325 MG PO TABS
650.0000 mg | ORAL_TABLET | Freq: Four times a day (QID) | ORAL | Status: DC | PRN
  Administered 2020-04-21 – 2020-04-22 (×5): 650 mg via ORAL
  Filled 2020-04-21 (×5): qty 2

## 2020-04-21 MED ORDER — LORAZEPAM 2 MG/ML IJ SOLN: 0.0000 mg | Freq: Two times a day (BID) | INTRAMUSCULAR | Status: DC

## 2020-04-21 MED ORDER — THIAMINE HCL 100 MG PO TABS: 100.0000 mg | ORAL_TABLET | Freq: Every day | ORAL | Status: DC

## 2020-04-21 MED ORDER — ADULT MULTIVITAMIN W/MINERALS CH
1.0000 | ORAL_TABLET | Freq: Every day | ORAL | Status: DC
  Administered 2020-04-21 – 2020-04-22 (×2): 1 via ORAL
  Filled 2020-04-21 (×5): qty 1

## 2020-04-21 MED ORDER — GABAPENTIN 100 MG PO CAPS
100.0000 mg | ORAL_CAPSULE | Freq: Two times a day (BID) | ORAL | Status: DC
  Administered 2020-04-21 – 2020-04-22 (×3): 100 mg via ORAL
  Filled 2020-04-21 (×8): qty 1

## 2020-04-21 MED ORDER — LORAZEPAM 1 MG PO TABS: 0.0000 mg | ORAL_TABLET | Freq: Two times a day (BID) | ORAL | Status: DC

## 2020-04-21 MED ORDER — HYDROXYZINE HCL 25 MG PO TABS
25.0000 mg | ORAL_TABLET | Freq: Four times a day (QID) | ORAL | Status: DC | PRN
  Administered 2020-04-22 (×2): 25 mg via ORAL
  Filled 2020-04-21 (×2): qty 1

## 2020-04-21 MED ORDER — ALUM & MAG HYDROXIDE-SIMETH 200-200-20 MG/5ML PO SUSP: 30.0000 mL | ORAL | Status: DC | PRN

## 2020-04-21 MED ORDER — LORAZEPAM 2 MG/ML IJ SOLN: 0.0000 mg | Freq: Four times a day (QID) | INTRAMUSCULAR | Status: DC

## 2020-04-21 MED ORDER — OXCARBAZEPINE 150 MG PO TABS
150.0000 mg | ORAL_TABLET | Freq: Every evening | ORAL | Status: DC | PRN
  Administered 2020-04-21: 150 mg via ORAL
  Filled 2020-04-21 (×2): qty 1

## 2020-04-21 MED ORDER — MAGNESIUM HYDROXIDE 400 MG/5ML PO SUSP: 30.0000 mL | Freq: Every day | ORAL | Status: DC | PRN

## 2020-04-21 MED ORDER — IBUPROFEN 200 MG PO TABS: 600.0000 mg | ORAL_TABLET | Freq: Three times a day (TID) | ORAL | Status: DC | PRN

## 2020-04-21 NOTE — [facility] Group Notes (Signed)
LCSW Group Therapy Note  04/21/2020   10:00-11:00am   Type of Therapy and Topic:  Group Therapy: Anger Cues and Responses  Participation Level:  Did not attend   Description of Group:   In this group, patients learned how to recognize the physical, cognitive, emotional, and behavioral responses they have to anger-provoking situations.  They identified a recent time they became angry and how they reacted.  They analyzed how their reaction was possibly beneficial and how it was possibly unhelpful.  The group discussed a variety of healthier coping skills that could help with such a situation in the future.  Focus was placed on how helpful it is to recognize the underlying emotions to our anger, because working on those can lead to a more permanent solution as.  Group discussion also focused on the way in which assuming you know what other people's intentions are can lead us to react inappropriately, but also how difficult it is to assume the best of others because of past hurts.  Therapeutic Goals: 1. Patients will remember their last incident of anger and how they felt emotionally and physically, what their thoughts were at the time, and how they behaved. 2. Patients will identify how their behavior at that time worked for them, as well as how it worked against them. 3. Patients will explore possible new behaviors to use in future anger situations. 4. Patients will learn that anger itself is normal and cannot be eliminated, and that healthier reactions can assist with resolving conflict rather than worsening situations.  Summary of Patient Progress:  N/A  Therapeutic Modalities:   Cognitive Behavioral Therapy  Mareida J Grossman-Orr 

## 2020-04-21 NOTE — Progress Notes (Signed)
Patient presents with a flat affect and anxious mood. She reported ongoing anxiety and depression today. She reported that she came in seeking help yesterday because she had an onset of sporadic suicidal ideations. She denies active suicidal ideations and verbally contracts for safety. She denies AVH. She was administered Neurontin and stated that she was scared to try new medications because she's allergic to SSRIs and a lot of other medication. Medication education provided to the pt. Writer informed the pt that the staff do 15 minute checks for safety and if she starts to experience any unusual side effects from the medication to report it to the nursing staff. Patient verbalized understanding.   Orders reviewed. Vital signs reviewed. Verbal support provided. 15 minute checks performed for safety.   Patient compliant with treatment plan and denies any medication side effects.

## 2020-04-21 NOTE — H&P (Signed)
Psychiatric Admission Assessment Adult  Patient Identification: Norma Elliott MRN:  [id number] Date of Evaluation:  04/21/2020 Chief Complaint:  MDD (major depressive disorder), recurrent severe, without psychosis (HCC) [F33.2] Principal Diagnosis: <principal problem not specified> Diagnosis:  Active Problems:   MDD (major depressive disorder), recurrent severe, without psychosis (HCC)  History of Present Illness: Patient is seen and examined.  Patient is a 40 year old female with a past psychiatric history significant for alcohol dependence, substance-induced mood disorder, alcohol withdrawal and insomnia who presented as a walk-in to the behavioral health hospital on 04/21/2020.  Her AA sponsor brought her in.  She reported increased depression with suicidal thoughts.  She has been having financial stressors, work stressors.  She has family members in the hospital.  She stated she had been drinking approximately 750 mL of vodka a day as well as wine.  She also has been prescribed clonazepam and has been taking that as well.  She admitted to poor sleep over the last several days.  She was initially sent to the [facility] emergency department for medical clearance, and then was sent back to our facility.  Her blood alcohol was less than 10.  She was admitted to the hospital for evaluation and stabilization.  Associated Signs/Symptoms: Depression Symptoms:  depressed mood, anhedonia, insomnia, psychomotor agitation, fatigue, feelings of worthlessness/guilt, difficulty concentrating, hopelessness, suicidal thoughts without plan, anxiety, loss of energy/fatigue, disturbed sleep, Duration of Depression Symptoms: Greater than two weeks  (Hypo) Manic Symptoms:  Impulsivity, Irritable Mood, Labiality of Mood, Anxiety Symptoms:  Excessive Worry, Psychotic Symptoms:  denied PTSD Symptoms: Had a traumatic exposure:  As a child Total Time spent with patient: 30 minutes  Past  Psychiatric History: Patient has a longstanding history of alcohol dependence.  She has had several years of sobriety in the past by her report.  She also has been previously diagnosed with attention deficit disorder and insomnia.  She receives benzodiazepines as well as stimulants from [facility].  She was last seen in our system at the behavioral health hospital on 11/17/2019.  She was hospitalized for 1 day at that time.  The patient refused changes in her medications at that time and decided to leave.  She denied any history of seizures, hallucinations or other complicated alcohol withdrawal symptoms.  Is the patient at risk to self? No.  Has the patient been a risk to self in the past 6 months? No.  Has the patient been a risk to self within the distant past? No.  Is the patient a risk to others? No.  Has the patient been a risk to others in the past 6 months? No.  Has the patient been a risk to others within the distant past? No.   Prior Inpatient Therapy:   Prior Outpatient Therapy:    Alcohol Screening: 1. How often do you have a drink containing alcohol?: 4 or more times a week 2. How many drinks containing alcohol do you have on a typical day when you are drinking?: 7, 8, or 9 3. How often do you have six or more drinks on one occasion?: Daily or almost daily AUDIT-C Score: 11 4. How often during the last year have you found that you were not able to stop drinking once you had started?: Weekly 5. How often during the last year have you failed to do what was normally expected from you because of drinking?: Weekly 6. How often during the last year have you needed a  first drink in the morning to get yourself going after a heavy drinking session?: Never 7. How often during the last year have you had a feeling of guilt of remorse after drinking?: Weekly 8. How often during the last year have you been unable to remember what happened the night before because you had  been drinking?: Monthly 9. Have you or someone else been injured as a result of your drinking?: No 10. Has a relative or friend or a doctor or another health worker been concerned about your drinking or suggested you cut down?: No Alcohol Use Disorder Identification Test Final Score (AUDIT): 22 Alcohol Brief Interventions/Follow-up: Alcohol Education Substance Abuse History in the last 12 months:  Yes.   Consequences of Substance Abuse: Medical Consequences:  Alcohol dependence clearly influencing this admission. Previous Psychotropic Medications: Yes  Psychological Evaluations: Yes  Past Medical History:  Past Medical History:  Diagnosis Date  . ADD (attention deficit disorder)   . Depression   . Esophageal mass   . History of fainting spells of unknown cause   . Hyperlipemia   . Insomnia   . Migraines   . Tension headache     Past Surgical History:  Procedure Laterality Date  . APPENDECTOMY     Family History:  Family History  Problem Relation Age of Onset  . Cancer Mother   . Arthritis Mother   . Hearing loss Mother   . Hyperlipidemia Mother   . Heart disease Mother   . COPD Father   . Hearing loss Father   . Hyperlipidemia Father   . Arthritis Maternal Grandmother   . Depression Maternal Grandmother   . Hearing loss Maternal Grandmother   . Mental retardation Maternal Grandmother   . Hyperlipidemia Maternal Grandfather   . Hypertension Maternal Grandfather   . Heart disease Maternal Grandfather   . Heart attack Maternal Grandfather   . Stroke Maternal Grandfather   . Diabetes Paternal Grandmother   . Kidney disease Paternal Grandmother   . Hearing loss Paternal Grandfather   . Heart disease Paternal Grandfather    Family Psychiatric  History: Noncontributory Tobacco Screening: Have you used any form of tobacco in the last 30 days? (Cigarettes, Smokeless Tobacco, Cigars, and/or Pipes): Yes Tobacco use, Select all that apply:  (e-cigarettes) Are you interested  in Tobacco Cessation Medications?: Yes, will notify MD for an order Counseled patient on smoking cessation including recognizing danger situations, developing coping skills and basic information about quitting provided: Refused/Declined practical counseling Social History:  Social History   Substance and Sexual Activity  Alcohol Use Yes  . Alcohol/week: 12.0 standard drinks  . Types: 10 Glasses of wine, 2 Standard drinks or equivalent per week   Comment: drinks 1-2 bottles of wine or of vodka     Social History   Substance and Sexual Activity  Drug Use No    Additional Social History: Marital status: Single Are you sexually active?: No Does patient have children?: No    Pain Medications: See MAR Prescriptions: See MAR Over the Counter: See MAR History of alcohol / drug use?: Yes Longest period of sobriety (when/how long): 30 days over the last couple of years. Was clean for 30 days and relapsed on March 14th Negative Consequences of Use: Personal relationships,Work / School Withdrawal Symptoms: Patient aware of relationship between substance abuse and physical/medical complications Name of Substance 1: ETOH 1 - Age of First Use: 18 1 - Amount (size/oz): 1-2 bottles of wine or of vodka 1 -  Frequency: daily since March 14th after 30 days sober 1 - Duration: on and off for the past 2 years regularly 1 - Last Use / Amount: 04/20/2020 1 - Method of Aquiring: Store 1- Route of Use: Oral                  Allergies:   Allergies  Allergen Reactions  . Sertraline Hcl Other (See Comments)  . Erythromycin   . Librium [Chlordiazepoxide]     Patient states that Librium caused her to have "an enlarged heart".   . Other Nausea And Vomiting    Patient stated that if she is given any pain medication she needs to be given zofran along with it. They cause her to vomit terribly.  . Seroquel [Quetiapine]     Patient states she "passed out" last time she took Seroquel.    . Serotonin Reuptake Inhibitors (Ssris)    Lab Results:  Results for orders placed or performed during the hospital encounter of 04/20/20 (from the past 48 hour(s))  Rapid urine drug screen (hospital performed)     Status: Abnormal   Collection Time: 04/20/20 11:29 PM  Result Value Ref Range   Opiates NONE DETECTED NONE DETECTED   Cocaine NONE DETECTED NONE DETECTED   Benzodiazepines NONE DETECTED NONE DETECTED   Amphetamines NONE DETECTED NONE DETECTED   Tetrahydrocannabinol POSITIVE (A) NONE DETECTED   Barbiturates NONE DETECTED NONE DETECTED    Comment: (NOTE) DRUG SCREEN FOR MEDICAL PURPOSES ONLY.  IF CONFIRMATION IS NEEDED FOR ANY PURPOSE, NOTIFY LAB WITHIN 5 DAYS.  LOWEST DETECTABLE LIMITS FOR URINE DRUG SCREEN Drug Class                     Cutoff (ng/mL) Amphetamine and metabolites    1000 Barbiturate and metabolites    200 Benzodiazepine                 200 Tricyclics and metabolites     300 Opiates and metabolites        300 Cocaine and metabolites        300 THC                            50 Performed at [facility], [address], [place], [place] [address]   Comprehensive metabolic panel     Status: Abnormal   Collection Time: 04/21/20 12:05 AM  Result Value Ref Range   Sodium 136 135 - 145 mmol/L   Potassium 3.5 3.5 - 5.1 mmol/L   Chloride 102 98 - 111 mmol/L   CO2 24 22 - 32 mmol/L   Glucose, Bld 105 (H) 70 - 99 mg/dL    Comment: Glucose reference range applies only to samples taken after fasting for at least 8 hours.   BUN 9 6 - 20 mg/dL   Creatinine, Ser 6.04 0.44 - 1.00 mg/dL   Calcium 8.4 (L) 8.9 - 10.3 mg/dL   Total Protein 7.5 6.5 - 8.1 g/dL   Albumin 4.4 3.5 - 5.0 g/dL   AST 17 15 - 41 U/L   ALT 13 0 - 44 U/L   Alkaline Phosphatase 78 38 - 126 U/L   Total Bilirubin 0.7 0.3 - 1.2 mg/dL   GFR, Estimated >54 >09 mL/min    Comment: (NOTE) Calculated using the CKD-EPI Creatinine Equation (2021)    Anion gap 10 5 - 15     Comment: Performed at  [facility], [address] [place], [place] [address]  Ethanol     Status: None   Collection Time: 04/21/20 12:05 AM  Result Value Ref Range   Alcohol, Ethyl (B) <10 <10 mg/dL    Comment: (NOTE) Lowest detectable limit for serum alcohol is 10 mg/dL.  For medical purposes only. Performed at [facility], [address], [place], [place] [address]   Salicylate level     Status: Abnormal   Collection Time: 04/21/20 12:05 AM  Result Value Ref Range   Salicylate Lvl <7.0 (L) 7.0 - 30.0 mg/dL    Comment: Performed at [facility], [address], [place], [place] [address]  Acetaminophen level     Status: Abnormal   Collection Time: 04/21/20 12:05 AM  Result Value Ref Range   Acetaminophen (Tylenol), Serum <10 (L) 10 - 30 ug/mL    Comment: (NOTE) Therapeutic concentrations vary significantly. A range of 10-30 ug/mL  may be an effective concentration for many patients. However, some  are best treated at concentrations outside of this range. Acetaminophen concentrations >150 ug/mL at 4 hours after ingestion  and >50 ug/mL at 12 hours after ingestion are often associated with  toxic reactions.  Performed at [facility], [address], [place], [place] [address]   cbc     Status: None   Collection Time: 04/21/20 12:05 AM  Result Value Ref Range   WBC 8.2 4.0 - 10.5 K/uL   RBC 4.37 3.87 - 5.11 MIL/uL   Hemoglobin 13.7 12.0 - 15.0 g/dL   HCT 65.7 84.6 - 96.2 %   MCV 93.8 80.0 - 100.0 fL   MCH 31.4 26.0 - 34.0 pg   MCHC 33.4 30.0 - 36.0 g/dL   RDW 95.2 84.1 - 32.4 %   Platelets 308 150 - 400 K/uL   nRBC 0.0 0.0 - 0.2 %    Comment: Performed at [facility], [address], [place], [place] [address]  I-Stat beta hCG blood, ED     Status: None   Collection Time: 04/21/20 12:13 AM  Result Value Ref Range   I-stat hCG, quantitative <5.0 <5 mIU/mL   Comment 3             Comment:   GEST. AGE      CONC.  (mIU/mL)   <=1 WEEK        5 - 50     2 WEEKS       50 - 500     3 WEEKS       100 - 10,000     4 WEEKS     1,000 - 30,000        FEMALE AND NON-PREGNANT FEMALE:     LESS THAN 5 mIU/mL   Resp Panel by RT-PCR (Flu A&B, Covid) Nasopharyngeal Swab     Status: None   Collection Time: 04/21/20 12:36 AM   Specimen: Nasopharyngeal Swab; Nasopharyngeal(NP) swabs in vial transport medium  Result Value Ref Range   SARS Coronavirus 2 by RT PCR NEGATIVE NEGATIVE    Comment: (NOTE) SARS-CoV-2 target nucleic acids are NOT DETECTED.  The SARS-CoV-2 RNA is generally detectable in upper respiratory specimens during the acute phase of infection. The lowest concentration of SARS-CoV-2 viral copies this assay can detect is 138 copies/mL. A negative result does not preclude SARS-Cov-2 infection and should not be used as the sole basis for treatment or other patient management decisions. A negative  result may occur with  improper specimen collection/handling, submission of specimen other than nasopharyngeal swab, presence of viral mutation(s) within the areas targeted by this assay, and inadequate number of viral copies(<138 copies/mL). A negative result must be combined with clinical observations, patient history, and epidemiological information. The expected result is Negative.  Fact Sheet for Patients:  BloggerCourse.com  Fact Sheet for Healthcare Providers:  SeriousBroker.it  This test is no t yet approved or cleared by the [place] FDA and  has been authorized for detection and/or diagnosis of SARS-CoV-2 by FDA under an Emergency Use Authorization (EUA). This EUA will remain  in effect (meaning this test can be used) for the duration of the COVID-19 declaration under Section 564(b)(1) of the Act, 21 U.S.C.section 360bbb-3(b)(1), unless the authorization is terminated  or revoked sooner.        Influenza A by PCR NEGATIVE NEGATIVE   Influenza B by PCR NEGATIVE NEGATIVE    Comment: (NOTE) The Xpert Xpress SARS-CoV-2/FLU/RSV plus assay is intended as an aid in the diagnosis of influenza from Nasopharyngeal swab specimens and should not be used as a sole basis for treatment. Nasal washings and aspirates are unacceptable for Xpert Xpress SARS-CoV-2/FLU/RSV testing.  Fact Sheet for Patients: BloggerCourse.com  Fact Sheet for Healthcare Providers: SeriousBroker.it  This test is not yet approved or cleared by the [place] FDA and has been authorized for detection and/or diagnosis of SARS-CoV-2 by FDA under an Emergency Use Authorization (EUA). This EUA will remain in effect (meaning this test can be used) for the duration of the COVID-19 declaration under Section 564(b)(1) of the Act, 21 U.S.C. section 360bbb-3(b)(1), unless the authorization is terminated or revoked.  Performed at [facility] [address], [place], [place] [address]     Blood Alcohol level:  Lab Results  Component Value Date   ETH <10 04/21/2020   ETH 242 (H) 11/16/2019    Metabolic Disorder Labs:  Lab Results  Component Value Date   HGBA1C 5.7 (H) 11/18/2019   MPG 117 11/18/2019   No results found for: PROLACTIN Lab Results  Component Value Date   CHOL 211 (H) 11/18/2019   TRIG 77 11/18/2019   HDL 80 11/18/2019   CHOLHDL 2.6 11/18/2019   VLDL 15 11/18/2019   LDLCALC 116 (H) 11/18/2019    Current Medications: Current Facility-Administered Medications  Medication Dose Route Frequency Provider Last Rate Last Admin  . acetaminophen (TYLENOL) tablet 650 mg  650 mg Oral Q6H PRN Jaclyn Shaggy, PA-C   650 mg at 04/21/20 0444  . alum & mag hydroxide-simeth (MAALOX/MYLANTA) 200-200-20 MG/5ML suspension 30 mL  30 mL Oral Q4H PRN Melbourne Abts W, PA-C      . gabapentin (NEURONTIN) capsule 100 mg  100 mg Oral BID Antonieta Pert, MD      . hydrOXYzine (ATARAX/VISTARIL) tablet 25 mg  25 mg Oral Q6H PRN Jaclyn Shaggy, PA-C      . loperamide (IMODIUM) capsule 2-4 mg  2-4 mg Oral PRN Jaclyn Shaggy, PA-C      . LORazepam (ATIVAN) tablet 1 mg  1 mg Oral Q6H PRN Melbourne Abts W, PA-C      . magnesium hydroxide (MILK OF MAGNESIA) suspension 30 mL  30 mL Oral Daily PRN Melbourne Abts W, PA-C      . multivitamin with minerals tablet 1 tablet  1 tablet Oral Daily Jaclyn Shaggy, PA-C   1 tablet at 04/21/20 1035  . nicotine (NICODERM CQ -  dosed in mg/24 hours) patch 21 mg  21 mg Transdermal Daily Armandina Stammer I, NP   21 mg at 04/21/20 1036  . ondansetron (ZOFRAN-ODT) disintegrating tablet 4 mg  4 mg Oral Q6H PRN Melbourne Abts W, PA-C      . thiamine tablet 100 mg  100 mg Oral Daily Ladona Ridgel, Cody W, PA-C      . traZODone (DESYREL) tablet 100 mg  100 mg Oral QHS PRN Jaclyn Shaggy, PA-C       PTA Medications: Medications Prior to Admission  Medication Sig Dispense Refill Last Dose  . clonazePAM (KLONOPIN) 1 MG tablet Take 2 mg by mouth at bedtime.     . traZODone (DESYREL) 100 MG tablet Take 1 tablet (100 mg total) by mouth at bedtime. 90 tablet 3     Musculoskeletal: Strength & Muscle Tone: within normal limits Gait & Station: normal Patient leans: N/A            Psychiatric Specialty Exam:  Presentation  General Appearance: Disheveled  Eye Contact:Fair  Speech:Normal Rate  Speech Volume:Normal  Handedness:Right   Mood and Affect  Mood:Anxious; Dysphoric  Affect:Congruent   Thought Process  Thought Processes:Coherent  Duration of Psychotic Symptoms: No data recorded Past Diagnosis of Schizophrenia or Psychoactive disorder: No  Descriptions of Associations:Intact  Orientation:Full (Time, Place and Person)  Thought Content:Logical  Hallucinations:Hallucinations: None  Ideas of Reference:None  Suicidal Thoughts:Suicidal Thoughts: No  Homicidal Thoughts:Homicidal Thoughts:  No   Sensorium  Memory:Immediate Fair; Recent Fair; Remote Fair  Judgment:Fair  Insight:Fair   Executive Functions  Concentration:Fair  Attention Span:Fair  Recall:Fair  Fund of Knowledge:Fair  Language:Fair   Psychomotor Activity  Psychomotor Activity:Psychomotor Activity: Decreased   Assets  Assets:Desire for Improvement; Resilience; Housing   Sleep  Sleep:Sleep: Poor    Physical Exam: Physical Exam Vitals and nursing note reviewed.  HENT:     Head: Normocephalic and atraumatic.  Pulmonary:     Effort: Pulmonary effort is normal.  Neurological:     General: No focal deficit present.     Mental Status: She is alert and oriented to person, place, and time.    ROS Blood pressure (!) 137/98, pulse (!) 105, temperature 98 F (36.7 C), temperature source Oral, resp. rate 18, height  (1.651 m), weight 53.5 kg, last menstrual period 04/20/2020. Body mass index is 19.64 kg/m.  Treatment Plan Summary: Daily contact with patient to assess and evaluate symptoms and progress in treatment, Medication management and Plan : Patient is seen and examined.  Patient is a 40 year old female with the above-stated past psychiatric history who was admitted secondary to alcohol dependence, alcohol withdrawal, substance-induced mood disorder versus depression and insomnia.  She will be admitted to the hospital.  She will be integrated in the milieu.  She will be encouraged to attend groups.  She will be placed on lorazepam 1 mg p.o. every 6 hours as needed a CIWA greater than 10.  She will also have available Zofran for nausea, Imodium for diarrhea.  Review of the PMP database reveals that she has been receiving Adderall 20 mg 1 tab p.o. 3 times daily for an extended period of time, and as well clonazepam 1 mg p.o. twice daily which she has been taking for anxiety and sleep.  This is apparently written by her primary care provider.  We discussed my concern with mixing  benzodiazepines with alcohol, and especially mixing benzodiazepines, alcohol and amphetamines.  Likely the withdrawal syndrome from the amphetamines will be  increased sleep.  She is emphatic that clonazepam is the only way she is able to sleep at night.  We discussed multiple options, but she stated that she is basically allergic to all serotonin-based drugs, Seroquel, Librium.  She also stated she had had reactions with mirtazapine.  She stated she had not been treated with gabapentin/Neurontin in the past.  Review of her admission laboratories revealed essentially normal electrolytes and normal liver function enzymes.  Her creatinine was 0.67.  CBC was normal.  Acetaminophen was less than 10, salicylate less than 7.  Beta-hCG was negative.  Respiratory panel was negative for influenza A, B and coronavirus.  Blood alcohol was less than 10.  Drug screen was negative for amphetamines, benzodiazepines but was positive for marijuana.  EKG is not been obtained.  She is afebrile.  She is mildly tachycardic at a rate of 105.  Blood pressure is mildly elevated at 137/98.  She denied any history of complicated alcohol withdrawal syndromes, no seizures or hallucinations.  She stated she had been in detox somewhere between 5-10 times, and rehabilitation facilities between 5-10 times.  She stated the last time she had been at [facility] in 2021 she remained there only 2 days.  Observation Level/Precautions:  Detox 15 minute checks  Laboratory:  Chemistry Profile  Psychotherapy:    Medications:    Consultations:    Discharge Concerns:    Estimated LOS:  Other:     Physician Treatment Plan for Primary Diagnosis: <principal problem not specified> Long Term Goal(s): Improvement in symptoms so as ready for discharge  Short Term Goals: Ability to identify changes in lifestyle to reduce recurrence of condition will improve, Ability to verbalize feelings will improve, Ability to demonstrate self-control will  improve, Ability to identify and develop effective coping behaviors will improve, Ability to maintain clinical measurements within normal limits will improve and Ability to identify triggers associated with substance abuse/mental health issues will improve  Physician Treatment Plan for Secondary Diagnosis: Active Problems:   MDD (major depressive disorder), recurrent severe, without psychosis (HCC)  Long Term Goal(s): Improvement in symptoms so as ready for discharge  Short Term Goals: Ability to identify changes in lifestyle to reduce recurrence of condition will improve, Ability to verbalize feelings will improve, Ability to demonstrate self-control will improve, Ability to identify and develop effective coping behaviors will improve, Ability to maintain clinical measurements within normal limits will improve and Ability to identify triggers associated with substance abuse/mental health issues will improve  I certify that inpatient services furnished can reasonably be expected to improve the patient's condition.    Antonieta Pert, MD 3/26/20221:11 PM

## 2020-04-21 NOTE — Tx Team (Signed)
Initial Treatment Plan 04/21/2020 5:48 AM Norma Elliott GPQ:[id number]    PATIENT STRESSORS: Financial difficulties Substance abuse Other: Work stress   PATIENT STRENGTHS: Ability for insight Average or above average intelligence Communication skills Motivation for treatment/growth Physical Health Supportive family/friends   PATIENT IDENTIFIED PROBLEMS:    " Drinking more" (Alcohol)   " Suicidal thoughts"                 DISCHARGE CRITERIA:  Improved stabilization in mood, thinking, and/or behavior Reduction of life-threatening or endangering symptoms to within safe limits  PRELIMINARY DISCHARGE PLAN: Outpatient therapy Return to previous living arrangement  PATIENT/FAMILY INVOLVEMENT: This treatment plan has been presented to and reviewed with the patient, Norma Elliott, and/or family member, .  The patient and family have been given the opportunity to ask questions and make suggestions.  Andres Ege, RN 04/21/2020, 5:48 AM

## 2020-04-21 NOTE — Progress Notes (Signed)
   04/21/20 2258  COVID-19 Daily Checkoff  Have you had a fever (temp > 37.80C/100F)  in the past 24 hours?  No  If you have had runny nose, nasal congestion, sneezing in the past 24 hours, has it worsened? No  COVID-19 EXPOSURE  Have you traveled outside the state in the past 14 days? No  Have you been in contact with someone with a confirmed diagnosis of COVID-19 or PUI in the past 14 days without wearing appropriate PPE? No  Have you been living in the same home as a person with confirmed diagnosis of COVID-19 or a PUI (household contact)? No  Have you been diagnosed with COVID-19? No

## 2020-04-21 NOTE — ED Notes (Addendum)
Report given Victorino Dike at [facility] with no further questions at this time.

## 2020-04-21 NOTE — ED Notes (Signed)
[facility] arrived for transport.

## 2020-04-21 NOTE — ED Notes (Signed)
Safe transport called for transportation to [facility]  

## 2020-04-21 NOTE — [facility] Group Notes (Signed)
.  Psychoeducational Group Note  Date: 04-21-2020 Time: 0900-1000    Goal Setting   Purpose of Group: This group helps to provide patients with the steps of setting a goal that is specific, measurable, attainable, realistic and time specific. A discussion on how we keep ourselves stuck with negative self talk.    Participation Level:  Did not attend   Thaison Kolodziejski A  
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
## 2020-04-21 NOTE — Progress Notes (Signed)
Patient ID: Norma Elliott, female   DOB: [birth year], 40 y.o.   MRN: [id number]  Norma Elliott is a 40 year old voluntary patient that was a walk-in at [facility] tonight. She came to [facility] for assessment after having thoughts to overdose on Klonopin. Her AA sponsor brought her in today. She has a hx of alcohol abuse and stated that she has not stayed clean from alcohol for the past couple of years. Reporting her longest sobriety is 30 days. Her latest relapse was March 14 th per patient and she has been drinking alcohol daily (either 1-2 bottles of wine or 750mg  of vodka). She reports increased depression with suicidal thoughts. Having some financial stressors, work stress, family members in the hospital, and told assessment she was being investigated by the [employer]. Reports that she was here in October 2021 briefly. Reports multiple allergies/intolerances with medications including most antidepressants (cause her suicidal ideations). Reports that she hasn't slept in the past 2-3 days. Cooperative with admission process.

## 2020-04-21 NOTE — Progress Notes (Signed)
Patient has been medically cleared in the ED and has been transferred back to [facility] to begin her inpatient psychiatric treatment (Please see my 10/25 H&P note regarding my initial assessment of the patient for further details if necessary). Admission Orders placed for the patient. Per my earlier 04/20/20 assessment of the patient, patient was noted to be exhibiting slight tremor, but patient did not appear to be experiencing severe withdrawal symptoms at that time. Notified by Dr. Jacqulyn Bath that patient did not appear to be experiencing severe withdrawal symptoms in the ED upon his assessment either. Patient received 1 mg of Ativan PO at 0046 in the ED.  Will initiate CIWA protocol with the following to address potential worsening of alcohol withdrawal symptoms:  -Ativan 1 mg PO Q6H PRN for CIWA > 10  -Imodium capsule 2-4 mg PO PRN for diarrhea/loose stools   -Vistaril 25 mg PO q6H PRN for anxiety/agitation or CIWA < or = 10  -Multivitamin with minerals PO Daily for vitamin/mineral supplementation   -Zofran ODT 4 mg PO q6H PRN for N/V  -Thiamine tablet 100 mg PO Daily for thiamine supplementation  Patient's home medication of Trazodone ordered for 100 mg PO at bedtime PRN for sleep

## 2020-04-21 NOTE — Progress Notes (Signed)
   04/21/20 2302  Psych Admission Type (Psych Patients Only)  Admission Status Voluntary  Psychosocial Assessment  Patient Complaints Anxiety  Eye Contact Fair  Facial Expression Anxious  Affect Appropriate to circumstance  Speech Logical/coherent  Interaction Assertive  Motor Activity Other (Comment) (WDL)  Appearance/Hygiene Unremarkable  Behavior Characteristics Appropriate to situation  Mood Anxious;Pleasant  Thought Process  Coherency WDL  Content WDL  Delusions None reported or observed  Perception WDL  Hallucination None reported or observed  Judgment Impaired  Confusion None  Danger to Self  Current suicidal ideation? Denies  Self-Injurious Behavior No self-injurious ideation or behavior indicators observed or expressed   Agreement Not to Harm Self Yes  Description of Agreement contracts  Danger to Others  Danger to Others None reported or observed
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
## 2020-04-22 DIAGNOSIS — F332 Major depressive disorder, recurrent severe without psychotic features: Principal | ICD-10-CM

## 2020-04-22 NOTE — Progress Notes (Signed)
D:  Patient denied SI and HI, contracts for safety.  Denied A/V hallucinations. A:  Medications administered per MD orders.  Emotional support and encouragement given patient. R:  Safety maintained with 15 minute checks.  Patient has been in bed most of the afternoon sleeping.  Stated she was going to call someone at work to tell them she would not be working tomorrow Monday.

## 2020-04-22 NOTE — [facility] Group Notes (Signed)
[facility] LCSW Group Therapy Note  04/22/2020    Type of Therapy and Topic:  Group Therapy:  Adding Supports Including Yourself  Participation Level:  Active   Description of Group:   Patients in this group were introduced to the concept that additional supports including self-support are an essential part of recovery.  Patients listed their current healthy and unhealthy supports, and discussed the difference between the two.   Several songs were played and a group discussion ensued in which patients stated they could relate to the songs which inspired them to realize they have be willing to help themselves in order to succeed, because other people cannot achieve sobriety or stability for them.  Parents were encouraged toward self-advocacy and self-support as part of their recovery.  They discussed their reactions to these songs' messages, which were positive and hopeful.  Before group ended, they identified the supports they believe they need to add to their lives to achieve their goals at discharge.   Therapeutic Goals: 1)  explain the difference between healthy and unhealthy supports and discuss what specific supports are currently in patients' lives 2)  demonstrate the importance of being a key part of one's own support system 3)  discuss the need for appropriate boundaries with supports 4)  elicit ideas from patients about supports that need to be added in order to achieve goals   Summary of Patient Progress:   The patient listed current healthy supports as Alcoholics Anonymous meetings and friends, her sponsor, and the people in her life who still talk to her and current unhealthy supports as her family and her substance abuse.  Prior to the end of group, patient expressed that supports she needs to add at discharge include therapy.    Therapeutic Modalities:   Motivational Interviewing Activity  Lynnell Chad

## 2020-04-22 NOTE — Progress Notes (Signed)
Patient stated she has alot of stress, chest pain.  History of enlarged heart while being on librium  Feels similar, is not alarmed.  Vistaril 25 mg given this morning.  Respirations even and unlabored.  No signs/symptoms of distress noted.

## 2020-04-22 NOTE — Plan of Care (Signed)
Nurse discussed anxiety, depression and coping skills with patient.  

## 2020-04-22 NOTE — [facility] Group Notes (Signed)
Psychoeducational Group Note    Date:04/22/2020 Time: 1300-1400    Life Skills:  A group where two lists are made. What people need and what are things that we do that are healthy. The lists are developed by the patients and it is explained that we often do the actions that are not healthy to get our list of needs met.   Purpose of Group: . The group focus' on teaching patients on how to identify their needs and how to develop the coping skills needed to get their needs met  Participation Level:  Active  Participation Quality:  Appropriate  Affect:  Appropriate  Cognitive:  Oriented  Insight:  Improving  Engagement in Group:  Engaged  Additional Comments:  Rates her energy at a 5. Pt was very invested in the group.  Paulino Rily

## 2020-04-22 NOTE — Progress Notes (Signed)
[facility]  04/22/2020 1:13 PM Norma Elliott  MRN:  [id number]  Subjective: Norma Elliott reports, "I developed suicidal ideations because I could not stop drinking alcohol or using drugs. I have been drinking heavily, using off & on x 2 years. I did attend AA meetings, will be sober 30 to 45 days, then will relapse again. [facility]. I'm unable to afford to go there or any place any more. But, I will need resources & tools that will help me together with my therapy sessions to stop substance abuse. My therapist & I had planned to tap into my trauma hx to start piecing together the reason for my addiction to substance. I don't do well on most psychotropic medications, the SSRIs & others. I have always had trouble sleeping at night. Klonopin is the only medicine that can help me sleep at night. They do the opposite on me. I have been recommended to enroll in DBT. But, I do feel safe here".    Objective: Patient is a 40 year old female with a past psychiatric history significant for alcohol dependence, substance-induced mood disorder, alcohol withdrawal and insomnia who presented as a walk-in to the behavioral health hospital on 04/21/2020. Her AA sponsor brought her in. She reported increased depression with suicidal thoughts. She has been having financial stressors, work stressors. She has family members in the hospital. She stated she had been drinking approximately 750 mL of vodka a day as well as wine. She also has been prescribed clonazepam and has been taking that as well. Daily notes: Norma Elliott is seen, chart reviewed. The chart findings discussed with the treatment team. She presents alert, oriented & aware of situation. She is visible on the unit, attending group sessions. She is endorsing symptoms of depression all directly related to her substance use issues & inability to maintain sobriety. She does however, deny any SIHI, AVH, delusional thoughts or paranoia.  She does not appear to be responding to any internal stimuli. She is encouraged to continue to attend group sessions for coping skills. She is in agreement to continue current plan of care as already in progress. Declined to start patient on klonopin. Encouraged to utilize the Trazodone as ordered prn for sleep.  Principal Problem: MDD (major depressive disorder), recurrent severe, without psychosis (HCC)  Diagnosis: Principal Problem:   MDD (major depressive disorder), recurrent severe, without psychosis (HCC)  Total Time spent with patient: 25 minutes  Past Psychiatric History: See H&P  Past Medical History:  Past Medical History:  Diagnosis Date  . ADD (attention deficit disorder)   . Depression   . Esophageal mass   . History of fainting spells of unknown cause   . Hyperlipemia   . Insomnia   . Migraines   . Tension headache     Past Surgical History:  Procedure Laterality Date  . APPENDECTOMY     Family History:  Family History  Problem Relation Age of Onset  . Cancer Mother   . Arthritis Mother   . Hearing loss Mother   . Hyperlipidemia Mother   . Heart disease Mother   . COPD Father   . Hearing loss Father   . Hyperlipidemia Father   . Arthritis Maternal Grandmother   . Depression Maternal Grandmother   . Hearing loss Maternal Grandmother   . Mental retardation Maternal Grandmother   . Hyperlipidemia Maternal Grandfather   . Hypertension Maternal Grandfather   . Heart disease Maternal Grandfather   .  Heart attack Maternal Grandfather   . Stroke Maternal Grandfather   . Diabetes Paternal Grandmother   . Kidney disease Paternal Grandmother   . Hearing loss Paternal Grandfather   . Heart disease Paternal Grandfather    Family Psychiatric  History: See H&P  Social History:  Social History   Substance and Sexual Activity  Alcohol Use Yes  . Alcohol/week: 12.0 standard drinks  . Types: 10 Glasses of wine, 2 Standard drinks or equivalent per week    Comment: drinks 1-2 bottles of wine or of vodka     Social History   Substance and Sexual Activity  Drug Use No    Social History   Socioeconomic History  . Marital status: Single    Spouse name: Not on file  . Number of children: 0  . Years of education: Not on file  . Highest education level: Not on file  Occupational History  . Occupation: Network engineer, Geophysicist/field seismologist services for companies    Employer: [employer]  Tobacco Use  . Smoking status: Former Smoker    Types: E-cigarettes  . Smokeless tobacco: Never Used  . Tobacco comment: smoking recently needs patch  Vaping Use  . Vaping Use: Former  Substance and Sexual Activity  . Alcohol use: Yes    Alcohol/week: 12.0 standard drinks    Types: 10 Glasses of wine, 2 Standard drinks or equivalent per week    Comment: drinks 1-2 bottles of wine or of vodka  . Drug use: No  . Sexual activity: Yes    Birth control/protection: Pill  Other Topics Concern  . Not on file  Social History Narrative  . Not on file   Social Determinants of Health   Financial Resource Strain: Not on file  Food Insecurity: Not on file  Transportation Needs: Not on file  Physical Activity: Not on file  Stress: Not on file  Social Connections: Not on file   Additional Social History:    Pain Medications: See MAR Prescriptions: See MAR Over the Counter: See MAR History of alcohol / drug use?: Yes Longest period of sobriety (when/how long): 30 days over the last couple of years. Was clean for 30 days and relapsed on March 14th Negative Consequences of Use: Personal relationships,Work / School Withdrawal Symptoms: Patient aware of relationship between substance abuse and physical/medical complications Name of Substance 1: ETOH 1 - Age of First Use: 18 1 - Amount (size/oz): 1-2 bottles of wine or of vodka 1 - Frequency: daily since March 14th after 30 days sober 1 - Duration: on and off for the past 2 years  regularly 1 - Last Use / Amount: 04/20/2020 1 - Method of Aquiring: Store 1- Route of Use: Oral  Sleep: Fair  Appetite:  Fair  Current Medications: Current Facility-Administered Medications  Medication Dose Route Frequency Provider Last Rate Last Admin  . acetaminophen (TYLENOL) tablet 650 mg  650 mg Oral Q6H PRN Jaclyn Shaggy, PA-C   650 mg at 04/22/20 0740  . alum & mag hydroxide-simeth (MAALOX/MYLANTA) 200-200-20 MG/5ML suspension 30 mL  30 mL Oral Q4H PRN Melbourne Abts W, PA-C      . gabapentin (NEURONTIN) capsule 100 mg  100 mg Oral BID Antonieta Pert, MD   100 mg at 04/22/20 0726  . hydrOXYzine (ATARAX/VISTARIL) tablet 25 mg  25 mg Oral Q6H PRN Jaclyn Shaggy, PA-C   25 mg at 04/22/20 0734  . loperamide (IMODIUM) capsule 2-4 mg  2-4 mg  Oral PRN Jaclyn Shaggy, PA-C      . LORazepam (ATIVAN) tablet 1 mg  1 mg Oral Q6H PRN Jaclyn Shaggy, PA-C   1 mg at 04/21/20 2112  . magnesium hydroxide (MILK OF MAGNESIA) suspension 30 mL  30 mL Oral Daily PRN Jaclyn Shaggy, PA-C      . multivitamin with minerals tablet 1 tablet  1 tablet Oral Daily Jaclyn Shaggy, PA-C   1 tablet at 04/22/20 8295  . nicotine (NICODERM CQ - dosed in mg/24 hours) patch 21 mg  21 mg Transdermal Daily Armandina Stammer I, NP   21 mg at 04/22/20 0728  . ondansetron (ZOFRAN-ODT) disintegrating tablet 4 mg  4 mg Oral Q6H PRN Jaclyn Shaggy, PA-C      . OXcarbazepine (TRILEPTAL) tablet 150 mg  150 mg Oral QHS PRN Antonieta Pert, MD   150 mg at 04/21/20 2112  . thiamine tablet 100 mg  100 mg Oral Daily Melbourne Abts W, PA-C   100 mg at 04/22/20 6213  . traZODone (DESYREL) tablet 100 mg  100 mg Oral QHS PRN Jaclyn Shaggy, PA-C   100 mg at 04/21/20 2112    Lab Results:  Results for orders placed or performed during the hospital encounter of 04/20/20 (from the past 48 hour(s))  Rapid urine drug screen (hospital performed)     Status: Abnormal   Collection Time: 04/20/20 11:29 PM  Result Value Ref Range   Opiates NONE  DETECTED NONE DETECTED   Cocaine NONE DETECTED NONE DETECTED   Benzodiazepines NONE DETECTED NONE DETECTED   Amphetamines NONE DETECTED NONE DETECTED   Tetrahydrocannabinol POSITIVE (A) NONE DETECTED   Barbiturates NONE DETECTED NONE DETECTED    Comment: (NOTE) DRUG SCREEN FOR MEDICAL PURPOSES ONLY.  IF CONFIRMATION IS NEEDED FOR ANY PURPOSE, NOTIFY LAB WITHIN 5 DAYS.  LOWEST DETECTABLE LIMITS FOR URINE DRUG SCREEN Drug Class                     Cutoff (ng/mL) Amphetamine and metabolites    1000 Barbiturate and metabolites    200 Benzodiazepine                 200 Tricyclics and metabolites     300 Opiates and metabolites        300 Cocaine and metabolites        300 THC                            50 Performed at [facility], [address], [place], [place] [address]   Comprehensive metabolic panel     Status: Abnormal   Collection Time: 04/21/20 12:05 AM  Result Value Ref Range   Sodium 136 135 - 145 mmol/L   Potassium 3.5 3.5 - 5.1 mmol/L   Chloride 102 98 - 111 mmol/L   CO2 24 22 - 32 mmol/L   Glucose, Bld 105 (H) 70 - 99 mg/dL    Comment: Glucose reference range applies only to samples taken after fasting for at least 8 hours.   BUN 9 6 - 20 mg/dL   Creatinine, Ser 8.46 0.44 - 1.00 mg/dL   Calcium 8.4 (L) 8.9 - 10.3 mg/dL   Total Protein 7.5 6.5 - 8.1 g/dL   Albumin 4.4 3.5 - 5.0 g/dL   AST 17 15 - 41 U/L   ALT 13 0 - 44 U/L   Alkaline  Phosphatase 78 38 - 126 U/L   Total Bilirubin 0.7 0.3 - 1.2 mg/dL   GFR, Estimated >30 >86 mL/min    Comment: (NOTE) Calculated using the CKD-EPI Creatinine Equation (2021)    Anion gap 10 5 - 15    Comment: Performed at [facility], [address] [place], [place] [address]  Ethanol     Status: None   Collection Time: 04/21/20 12:05 AM  Result Value Ref Range   Alcohol, Ethyl (B) <10 <10 mg/dL    Comment: (NOTE) Lowest detectable limit for serum alcohol is 10 mg/dL.  For medical  purposes only. Performed at [facility], [address] [place], [place] [address]   Salicylate level     Status: Abnormal   Collection Time: 04/21/20 12:05 AM  Result Value Ref Range   Salicylate Lvl <7.0 (L) 7.0 - 30.0 mg/dL    Comment: Performed at [facility], [address] [place], [place] [address]  Acetaminophen level     Status: Abnormal   Collection Time: 04/21/20 12:05 AM  Result Value Ref Range   Acetaminophen (Tylenol), Serum <10 (L) 10 - 30 ug/mL    Comment: (NOTE) Therapeutic concentrations vary significantly. A range of 10-30 ug/mL  may be an effective concentration for many patients. However, some  are best treated at concentrations outside of this range. Acetaminophen concentrations >150 ug/mL at 4 hours after ingestion  and >50 ug/mL at 12 hours after ingestion are often associated with  toxic reactions.  Performed at [facility], [address] [place], [place] [address]   cbc     Status: None   Collection Time: 04/21/20 12:05 AM  Result Value Ref Range   WBC 8.2 4.0 - 10.5 K/uL   RBC 4.37 3.87 - 5.11 MIL/uL   Hemoglobin 13.7 12.0 - 15.0 g/dL   HCT 02.7 25.3 - 66.4 %   MCV 93.8 80.0 - 100.0 fL   MCH 31.4 26.0 - 34.0 pg   MCHC 33.4 30.0 - 36.0 g/dL   RDW 40.3 47.4 - 25.9 %   Platelets 308 150 - 400 K/uL   nRBC 0.0 0.0 - 0.2 %    Comment: Performed at [facility], [address], [place], [place] [address]  I-Stat beta hCG blood, ED     Status: None   Collection Time: 04/21/20 12:13 AM  Result Value Ref Range   I-stat hCG, quantitative <5.0 <5 mIU/mL   Comment 3            Comment:   GEST. AGE      CONC.  (mIU/mL)   <=1 WEEK        5 - 50     2 WEEKS       50 - 500     3 WEEKS       100 - 10,000     4 WEEKS     1,000 - 30,000        FEMALE AND NON-PREGNANT FEMALE:     LESS THAN 5 mIU/mL   Resp Panel by RT-PCR (Flu A&B, Covid) Nasopharyngeal Swab     Status: None    Collection Time: 04/21/20 12:36 AM   Specimen: Nasopharyngeal Swab; Nasopharyngeal(NP) swabs in vial transport medium  Result Value Ref Range   SARS Coronavirus 2 by RT PCR NEGATIVE NEGATIVE    Comment: (NOTE) SARS-CoV-2 target nucleic acids are NOT DETECTED.  The SARS-CoV-2 RNA is generally detectable  in upper respiratory specimens during the acute phase of infection. The lowest concentration of SARS-CoV-2 viral copies this assay can detect is 138 copies/mL. A negative result does not preclude SARS-Cov-2 infection and should not be used as the sole basis for treatment or other patient management decisions. A negative result may occur with  improper specimen collection/handling, submission of specimen other than nasopharyngeal swab, presence of viral mutation(s) within the areas targeted by this assay, and inadequate number of viral copies(<138 copies/mL). A negative result must be combined with clinical observations, patient history, and epidemiological information. The expected result is Negative.  Fact Sheet for Patients:  BloggerCourse.com  Fact Sheet for Healthcare Providers:  SeriousBroker.it  This test is no t yet approved or cleared by the [place] FDA and  has been authorized for detection and/or diagnosis of SARS-CoV-2 by FDA under an Emergency Use Authorization (EUA). This EUA will remain  in effect (meaning this test can be used) for the duration of the COVID-19 declaration under Section 564(b)(1) of the Act, 21 U.S.C.section 360bbb-3(b)(1), unless the authorization is terminated  or revoked sooner.       Influenza A by PCR NEGATIVE NEGATIVE   Influenza B by PCR NEGATIVE NEGATIVE    Comment: (NOTE) The Xpert Xpress SARS-CoV-2/FLU/RSV plus assay is intended as an aid in the diagnosis of influenza from Nasopharyngeal swab specimens and should not be used as a sole basis for treatment. Nasal washings  and aspirates are unacceptable for Xpert Xpress SARS-CoV-2/FLU/RSV testing.  Fact Sheet for Patients: BloggerCourse.com  Fact Sheet for Healthcare Providers: SeriousBroker.it  This test is not yet approved or cleared by the [place] FDA and has been authorized for detection and/or diagnosis of SARS-CoV-2 by FDA under an Emergency Use Authorization (EUA). This EUA will remain in effect (meaning this test can be used) for the duration of the COVID-19 declaration under Section 564(b)(1) of the Act, 21 U.S.C. section 360bbb-3(b)(1), unless the authorization is terminated or revoked.  Performed at [facility], [address] [place], [place] [address]    Blood Alcohol level:  Lab Results  Component Value Date   ETH <10 04/21/2020   ETH 242 (H) 11/16/2019   Metabolic Disorder Labs: Lab Results  Component Value Date   HGBA1C 5.7 (H) 11/18/2019   MPG 117 11/18/2019   No results found for: PROLACTIN Lab Results  Component Value Date   CHOL 211 (H) 11/18/2019   TRIG 77 11/18/2019   HDL 80 11/18/2019   CHOLHDL 2.6 11/18/2019   VLDL 15 11/18/2019   LDLCALC 116 (H) 11/18/2019   Physical Findings: AIMS: Facial and Oral Movements Muscles of Facial Expression: None, normal Lips and Perioral Area: None, normal Jaw: None, normal Tongue: None, normal,Extremity Movements Upper (arms, wrists, hands, fingers): None, normal Lower (legs, knees, ankles, toes): None, normal, Trunk Movements Neck, shoulders, hips: None, normal, Overall Severity Severity of abnormal movements (highest score from questions above): None, normal Incapacitation due to abnormal movements: None, normal Patient's awareness of abnormal movements (rate only patient's report): No Awareness, Dental Status Current problems with teeth and/or dentures?: No Does patient usually wear dentures?: No  CIWA:  CIWA-Ar Total: 1 COWS:      Musculoskeletal: Strength & Muscle Tone: within normal limits Gait & Station: normal Patient leans: N/A  Psychiatric Specialty Exam:  Presentation  General Appearance: Disheveled  Eye Contact:Fair  Speech:Normal Rate  Speech Volume:Normal  Handedness:Right   Mood and Affect  Mood:Anxious; Dysphoric  Affect:Congruent   Thought Process  Thought Processes:Coherent  Descriptions of Associations:Intact  Orientation:Full (Time, Place and Person)  Thought Content:Logical  History of Schizophrenia/Schizoaffective disorder:No  Duration of Psychotic Symptoms:No data recorded Hallucinations:Hallucinations: None  Ideas of Reference:None  Suicidal Thoughts:Suicidal Thoughts: No  Homicidal Thoughts:Homicidal Thoughts: No   Sensorium  Memory:Immediate Fair; Recent Fair; Remote Fair  Judgment:Fair  Insight:Fair   Executive Functions  Concentration:Fair  Attention Span:Fair  Recall:Fair  Fund of Knowledge:Fair  Language:Fair   Psychomotor Activity  Psychomotor Activity:Psychomotor Activity: Decreased   Assets  Assets:Desire for Improvement; Resilience; Housing   Sleep  Sleep:Sleep: Poor  Physical Exam: Physical Exam Vitals and nursing note reviewed.  HENT:     Head: Normocephalic.     Nose: Nose normal.     Mouth/Throat:     Pharynx: Oropharynx is clear.  Eyes:     Pupils: Pupils are equal, round, and reactive to light.  Cardiovascular:     Rate and Rhythm: Normal rate.  Pulmonary:     Effort: Pulmonary effort is normal.  Genitourinary:    Comments: Deferred Musculoskeletal:        General: Normal range of motion.     Cervical back: Normal range of motion.  Skin:    General: Skin is warm and dry.  Neurological:     General: No focal deficit present.     Mental Status: She is alert and oriented to person, place, and time.    Review of Systems  Constitutional: Negative for chills and fever.  HENT: Negative for congestion and  sore throat.   Eyes: Negative for blurred vision.  Respiratory: Negative for cough, shortness of breath and wheezing.   Cardiovascular: Negative for chest pain and palpitations.  Gastrointestinal: Negative for abdominal pain, constipation, diarrhea, heartburn, nausea and vomiting.  Genitourinary: Negative for dysuria.  Musculoskeletal: Negative for myalgias.  Skin: Negative.   Neurological: Negative for dizziness, tingling, tremors, sensory change, speech change, focal weakness, seizures, loss of consciousness, weakness and headaches.  Endo/Heme/Allergies: Negative for environmental allergies. Does not bruise/bleed easily.       Allergies:  Sertraline.  Erythromycin.  Librium.   Seroquel  Serotonin Reuptake Inhibitors (SSRI).       Psychiatric/Behavioral: Positive for depression and substance abuse (Hx. of). Negative for hallucinations, memory loss and suicidal ideas. The patient is nervous/anxious and has insomnia.    Blood pressure (!) 149/89, pulse 94, temperature 98.4 F (36.9 C), temperature source Oral, resp. rate 18, height 5\' 5"  (1.651 m), weight 53.5 kg, last menstrual period 04/20/2020, SpO2 100 %. Body mass index is 19.64 kg/m.  Treatment Plan Summary: Daily contact with patient to assess and evaluate symptoms and progress in treatment and Medication management.  Continue inpatient hospitalization. Will continue today 04/22/2020 plan as below except where it is noted.  Agitation/anxiety. Continue gabapentin 100 mg po bid. Continue Vistaril 25 mg po Q 6 hours prn.  Insomnia. Continue Trileptal 150 mg po Q hs prn. Continue Trazodone 100 mg po Q hs prn.  Other prn medications: Continue'  Acetaminophen 650 mg po Q 4 hrs prn for pain/fever. Mylanta 30 ml po Q 4 hrs prn for indigestion. Imodium 2-4 mg po prn for diarrhea. Lorazepam 1 mg po Q 6 hrs prn for CIWA > 10. MOM 30 ml po Q daily for constipation. Zofran-ODT 4 mg po Q 6 hrs prn for nausea.  Encourage group  participation. Discharge disposition plan in progress.  04/24/2020, NP, PMHNP, FNP-BC 04/22/2020, 1:13 PM

## 2020-04-22 NOTE — [facility] Group Notes (Signed)
Adult Psychoeducational Group Not Date:  04/22/2020 Time:  9518-8416 Group Topic/Focus: PROGRESSIVE RELAXATION. A group where deep breathing is taught and tensing and relaxation muscle groups is used. Imagery is used as well.  Pts are asked to imagine 3 pillars that hold them up when they are not able to hold themselves up.  Participation Level:  Active  Participation Quality:  Appropriate  Affect:  Appropriate  Cognitive:  Oriented  Insight: Improving  Engagement in Group:  Engaged  Modes of Intervention:  Activity, Discussion, Education, and Support  Additional Comments:  Rates her energy as a 4/10. Pt's pillars are: AA, Friends, and the [employer]. Dione Housekeeper

## 2020-04-22 NOTE — Progress Notes (Signed)
   04/22/20 2236  COVID-19 Daily Checkoff  Have you had a fever (temp > 37.80C/100F)  in the past 24 hours?  No  If you have had runny nose, nasal congestion, sneezing in the past 24 hours, has it worsened? No  COVID-19 EXPOSURE  Have you traveled outside the state in the past 14 days? No  Have you been in contact with someone with a confirmed diagnosis of COVID-19 or PUI in the past 14 days without wearing appropriate PPE? No  Have you been living in the same home as a person with confirmed diagnosis of COVID-19 or a PUI (household contact)? No  Have you been diagnosed with COVID-19? No
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
## 2020-04-23 MED ORDER — GABAPENTIN 100 MG PO CAPS: 100.0000 mg | ORAL_CAPSULE | Freq: Two times a day (BID) | ORAL | 0 refills | Status: AC

## 2020-04-23 MED ORDER — TRAZODONE HCL 100 MG PO TABS: 100.0000 mg | ORAL_TABLET | Freq: Every evening | ORAL | 0 refills | Status: AC | PRN

## 2020-04-23 NOTE — Discharge Summary (Signed)
Physician Discharge Summary Note  Patient:  Norma Elliott is an 40 y.o., female MRN:  [id number] DOB:  Jul 02, 1980 Patient phone:  [phone] (home)  Patient address:   [address] [place] [id number],  Total Time spent with patient: 30 minutes  Date of Admission:  04/21/2020 Date of Discharge: 04/23/2020  Reason for Admission:  (From MD's admission note): Patient is seen and examined. Patient is a 40 year old female with a past psychiatric history significant for alcohol dependence, substance-induced mood disorder, alcohol withdrawal and insomnia who presented as a walk-in to the behavioral health hospital on 04/21/2020. Her AA sponsor brought her in. She reported increased depression with suicidal thoughts. She has been having financial stressors, work stressors. She has family members in the hospital. She stated she had been drinking approximately 750 mL of vodka a day as well as wine. She also has been prescribed clonazepam and has been taking that as well. She admitted to poor sleep over the last several days. She was [facility] for medical clearance, and then was sent back to our facility. Her blood alcohol was less than 10. She was admitted to the hospital for evaluation and stabilization.  Evaluation on the unit, day of discharge: Patient was seen earlier in the day by the attending MD (see progress note). Patient later decided she would like to be discharged and was in agreement to follow up with her sponsor and take her medications. She has follow up appointments in place. Patient reported good sleep and appetite. She has been taking her medications and agrees to continue to take them after discharge. Patient denies SI/HI/AVH, paranoia and delusions. Patient has been attending group therapy and interacting appropriately with staff and peers. Patient is stable for discharge home today.   Principal Problem: MDD  (major depressive disorder), recurrent severe, without psychosis (HCC) Discharge Diagnoses: Principal Problem:   MDD (major depressive disorder), recurrent severe, without psychosis (HCC)  Past Psychiatric History: See H&P  Past Medical History:  Past Medical History:  Diagnosis Date  . ADD (attention deficit disorder)   . Depression   . Esophageal mass   . History of fainting spells of unknown cause   . Hyperlipemia   . Insomnia   . Migraines   . Tension headache     Past Surgical History:  Procedure Laterality Date  . APPENDECTOMY     Family History:  Family History  Problem Relation Age of Onset  . Cancer Mother   . Arthritis Mother   . Hearing loss Mother   . Hyperlipidemia Mother   . Heart disease Mother   . COPD Father   . Hearing loss Father   . Hyperlipidemia Father   . Arthritis Maternal Grandmother   . Depression Maternal Grandmother   . Hearing loss Maternal Grandmother   . Mental retardation Maternal Grandmother   . Hyperlipidemia Maternal Grandfather   . Hypertension Maternal Grandfather   . Heart disease Maternal Grandfather   . Heart attack Maternal Grandfather   . Stroke Maternal Grandfather   . Diabetes Paternal Grandmother   . Kidney disease Paternal Grandmother   . Hearing loss Paternal Grandfather   . Heart disease Paternal Grandfather    Family Psychiatric  History: See H&P Social History:  Social History   Substance and Sexual Activity  Alcohol Use Yes  . Alcohol/week: 12.0 standard drinks  . Types: 10 Glasses of wine, 2 Standard drinks or equivalent per week   Comment: drinks  1-2 bottles of wine or of vodka     Social History   Substance and Sexual Activity  Drug Use No     Hospital Course:  After the above admission evaluation, Norma Elliott's presenting symptoms were noted. She was recommended for mood stabilization treatments. The medication regimen targeting those presenting symptoms were discussed with her & initiated with her  consent. Her UDS on arrival to the ED was positive for THC, BAL was negative. She was however medicated, stabilized & discharged on the medications as listed on her discharge medication lists below. Besides the mood stabilization treatments, Norma Elliott was also enrolled & participated in the group counseling sessions being offered & held on this unit. She learned coping skills. She presented no other significant pre-existing medical issues that required treatment. She tolerated his treatment regimen without any adverse effects or reactions reported.   During the course of her hospitalization, the 15-minute checks were adequate to ensure patient's safety. Norma Elliott did not display any dangerous, violent or suicidal behavior on the unit. She interacted with patients & staff appropriately, participated appropriately in the group sessions/therapies. Her medications were addressed & adjusted to meet her needs. She was recommended for outpatient follow-up care & medication management upon discharge to assure continuity of care & mood stability.  At the time of discharge patient is not reporting any acute suicidal/homicidal ideations. She feels more confident about her self-care & in managing his mental health. She currently denies any new issues or concerns. Education and supportive counseling provided throughout her hospital stay & upon discharge.   Today upon her discharge evaluation with the attending psychiatrist, Norma Elliott shares she is doing well. She denies any other specific concerns. She is sleeping well. Her appetite is good. She denies other physical complaints. She denies AH/VH, delusional thoughts or paranoia. She does not appear to be responding to any internal stimuli. She feels that her medications have been helpful & is in agreement to continue her current treatment regimen as recommended. She was able to engage in safety planning including plan to return to [facility] or contact emergency services if she feels unable to  maintain her own safety or the safety of others. Pt had no further questions, comments, or concerns. She left [facility] with all personal belongings in no apparent distress. Transportation home per friend.   Physical Findings: AIMS: Facial and Oral Movements Muscles of Facial Expression: None, normal Lips and Perioral Area: None, normal Jaw: None, normal Tongue: None, normal,Extremity Movements Upper (arms, wrists, hands, fingers): None, normal Lower (legs, knees, ankles, toes): None, normal, Trunk Movements Neck, shoulders, hips: None, normal, Overall Severity Severity of abnormal movements (highest score from questions above): None, normal Incapacitation due to abnormal movements: None, normal Patient's awareness of abnormal movements (rate only patient's report): No Awareness, Dental Status Current problems with teeth and/or dentures?: No Does patient usually wear dentures?: No  CIWA:  CIWA-Ar Total: 1 COWS:     Musculoskeletal: Strength & Muscle Tone: within normal limits Gait & Station: normal Patient leans: N/A  Psychiatric Specialty Exam:  Presentation  General Appearance: Disheveled  Eye Contact:Minimal  Speech:Normal Rate  Speech Volume:Decreased  Handedness:Right  Mood and Affect  Mood:Anxious; Depressed  Affect:Congruent  Thought Process  Thought Processes:Coherent  Descriptions of Associations:Circumstantial  Orientation:Full (Time, Place and Person)  Thought Content:Rumination  History of Schizophrenia/Schizoaffective disorder:No  Duration of Psychotic Symptoms:No data recorded Hallucinations:Hallucinations: None  Ideas of Reference:None  Suicidal Thoughts:Suicidal Thoughts: Yes, Passive SI Passive Intent and/or Plan: Without Intent  Homicidal  Thoughts:Homicidal Thoughts: No  Sensorium  Memory:Immediate Fair; Recent Fair; Remote Fair  Judgment:Fair  Insight:Fair  Executive Functions  Concentration:Fair  Attention  Span:Fair  Recall:Fair  Fund of Knowledge:Fair  Language:Good  Psychomotor Activity  Psychomotor Activity:Psychomotor Activity: Increased  Assets  Assets:Desire for Improvement; Resilience  Sleep  Sleep:No data recorded  Physical Exam: Physical Exam ROS Blood pressure 119/74, pulse 81, temperature 98.4 F (36.9 C), temperature source Oral, resp. rate 18, height 5\' 5"  (1.651 m), weight 53.5 kg, last menstrual period 04/20/2020, SpO2 100 %. Body mass index is 19.64 kg/m.   Have you used any form of tobacco in the last 30 days? (Cigarettes, Smokeless Tobacco, Cigars, and/or Pipes): Yes  Has this patient used any form of tobacco in the last 30 days? (Cigarettes, Smokeless Tobacco, Cigars, and/or Pipes) Yes, N/A  Blood Alcohol level:  Lab Results  Component Value Date   ETH <10 04/21/2020   ETH 242 (H) 11/16/2019    Metabolic Disorder Labs:  Lab Results  Component Value Date   HGBA1C 5.7 (H) 11/18/2019   MPG 117 11/18/2019   No results found for: PROLACTIN Lab Results  Component Value Date   CHOL 211 (H) 11/18/2019   TRIG 77 11/18/2019   HDL 80 11/18/2019   CHOLHDL 2.6 11/18/2019   VLDL 15 11/18/2019   LDLCALC 116 (H) 11/18/2019    See Psychiatric Specialty Exam and Suicide Risk Assessment completed by Attending Physician prior to discharge.  Discharge destination:  Home  Is patient on multiple antipsychotic therapies at discharge:  No   Has Patient had three or more failed trials of antipsychotic monotherapy by history:  No  Recommended Plan for Multiple Antipsychotic Therapies: NA  Discharge Instructions    Diet - low sodium heart healthy   Complete by: As directed    Increase activity slowly   Complete by: As directed      Allergies as of 04/23/2020      Reactions   Sertraline Hcl Other (See Comments)   Erythromycin    Librium [chlordiazepoxide]    Patient states that Librium caused her to have "an enlarged heart".    Other Nausea And Vomiting    Patient stated that if she is given any pain medication she needs to be given zofran along with it. They cause her to vomit terribly.   Seroquel [quetiapine]    Patient states she "passed out" last time she took Seroquel.    Serotonin Reuptake Inhibitors (ssris)       Medication List    TAKE these medications     Indication  clonazePAM 1 MG tablet Commonly known as: KLONOPIN Take 2 mg by mouth at bedtime.  Indication: Feeling Anxious   gabapentin 100 MG capsule Commonly known as: NEURONTIN Take 1 capsule (100 mg total) by mouth 2 (two) times daily.  Indication: Abuse or Misuse of Alcohol   traZODone 100 MG tablet Commonly known as: DESYREL Take 1 tablet (100 mg total) by mouth at bedtime as needed for sleep. What changed:   when to take this  reasons to take this  Indication: Trouble Sleeping       Follow-up Information    [facility] Go on 04/30/2020.   Why: You have an appointment on 04/30/20 at 11:00 am for therapy services.  This appointment will be held in person. * PLEASE CALL TO CONFIRM THIS APPOINTMENT AND RECIEVE DETAILS.  Contact information: [address] [place] [phone]  Viviann Spare Bissette Follow up on 05/04/2020.   Why: You have an appointment for medication management services with your primary care provider on 05/04/20 at 10:00 am.  This appointment will be held in person.  Contact information: [facility]  [address] [place], [place] [address]  Phone: [phone]               Follow-up recommendations:  Activity:  as tolerated Diet:  Heart healthy  Comments:  Prescriptions given at discharge.  Patient is agreeable to plan.  Patient given the opportunity to ask questions.  She appears to feel comfortable with discharge and denies any current suicidal or homicidal thoughts.   Patient is instructed prior to discharge to: Take all medications as prescribed by her mental healthcare provider. Report  any adverse effects and or reactions from the medicines to her outpatient provider promptly. Patient has been instructed & cautioned: To not engage in alcohol and or illegal drug use while on prescription medicines. In the event of worsening symptoms, patient is instructed to call the crisis hotline, 911 and or go to the nearest ED for appropriate evaluation and treatment of symptoms. To follow-up with her primary care provider for your other medical issues, concerns and or health care needs.   Signed: Laveda Abbe, NP 04/23/2020, 3:42 PM

## 2020-04-23 NOTE — Progress Notes (Signed)
D: Patient denies SI/HI/AVH. Pt. Denies anxiety and depression. Pt. Complained of back pain. Pt. Was on the phone crying and went to her room crying.  A: Patient refused all medicines except nicotine patch.  Patient refused PRN medicine for back pain. Support and encouragement provided Routine safety checks conducted every 15 minutes. Patient  Informed to notify staff with any concerns.   R: Safety maintained.

## 2020-04-23 NOTE — BHH Suicide Risk Assessment (Signed)
[facility] Discharge Suicide Risk Assessment   Principal Problem: MDD (major depressive disorder), recurrent severe, without psychosis (HCC) Discharge Diagnoses: Principal Problem:   MDD (major depressive disorder), recurrent severe, without psychosis (HCC)   Total Time spent with patient: 30 minutes  Musculoskeletal: Strength & Muscle Tone: within normal limits Gait & Station: normal Patient leans: N/A  Psychiatric Specialty Exam: Review of Systems  All other systems reviewed and are negative.   Blood pressure 119/74, pulse 81, temperature 98.4 F (36.9 C), temperature source Oral, resp. rate 18, height 5\' 5"  (1.651 m), weight 53.5 kg, last menstrual period 04/20/2020, SpO2 100 %.Body mass index is 19.64 kg/m.  General Appearance: Casual  Eye Contact::  Fair  Speech:  Normal Rate409  Volume:  Normal  Mood:  Anxious  Affect:  Congruent  Thought Process:  Coherent and Descriptions of Associations: Intact  Orientation:  Full (Time, Place, and Person)  Thought Content:  Logical  Suicidal Thoughts:  No  Homicidal Thoughts:  No  Memory:  Immediate;   Good Recent;   Good Remote;   Good  Judgement:  Intact  Insight:  Fair  Psychomotor Activity:  Increased  Concentration:  Fair  Recall:  Good  Fund of Knowledge:Good  Language: Good  Akathisia:  Negative  Handed:  Right  AIMS (if indicated):     Assets:  Communication Skills Desire for Improvement Financial Resources/Insurance Housing Resilience Social Support Talents/Skills Vocational/Educational  Sleep:  Number of Hours: 6.75  Cognition: WNL  ADL's:  Intact   Mental Status Per Nursing Assessment::   On Admission:  Suicidal ideation indicated by patient  Demographic Factors:  Caucasian and Living alone  Loss Factors: Decrease in vocational status  Historical Factors: Impulsivity  Risk Reduction Factors:   Positive coping skills or problem solving skills  Continued Clinical Symptoms:  Severe Anxiety and/or  Agitation Depression:   Comorbid alcohol abuse/dependence Impulsivity Insomnia Alcohol/Substance Abuse/Dependencies  Cognitive Features That Contribute To Risk:  None    Suicide Risk:  Minimal: No identifiable suicidal ideation.  Patients presenting with no risk factors but with morbid ruminations; may be classified as minimal risk based on the severity of the depressive symptoms   Follow-up Information    [phone], MD. Go on 05/04/2020.   Specialty: Family Medicine Why: You have an appointment for medication management services with your primary care provider on 05/04/20 at 10:00 am.  This appointment will be held in person.   Contact information: [address] [place] [phone]        [facility] Go on 04/30/2020.   Why: You have an appointment on 04/30/20 at 11:00 am for therapy services.  This appointment will be held in person. * PLEASE CALL TO CONFIRM THIS APPOINTMENT AND RECIEVE DETAILS.  Contact information: [address] [place] [phone]               Plan Of Care/Follow-up recommendations:  Activity:  ad lib  [phone], MD 04/23/2020, 3:11 PM

## 2020-04-23 NOTE — BHH Group Notes (Signed)
Occupational Therapy Group Note Date: 04/23/2020 Group Topic/Focus: Stress Management  Group Description: Group encouraged increased participation and engagement through discussion focused on topic of stress management. Patients engaged interactively to discuss components of stress including physical signs, emotional signs, negative management strategies, and positive management strategies. Each individual identified one new stress management strategy they would like to try moving forward.    Therapeutic Goals: Identify current stressors Identify healthy vs unhealthy stress management strategies/techniques Discuss and identify physical and emotional signs of stress Participation Level: Active   Participation Quality: Independent   Behavior: Cooperative and Interactive   Speech/Thought Process: Focused   Affect/Mood: Euthymic   Insight: Fair   Judgement: Fair   Individualization: Nesta was active in their participation of group discussion/activity. She shared that something she accomplished today was 'taking a shower.' Pt contributed actively in discussion and shared 'drinking alcohol' as a negative strategy she uses to manage her stress and "making bracelets" and "breathing" as more effective strategies.   Modes of Intervention: Activity, Discussion and Education  Patient Response to Interventions:  Attentive, Engaged, Receptive and Interested   Plan: Continue to engage patient in OT groups 2 - 3x/week.  04/23/2020  Donne Hazel, MOT, OTR/L

## 2020-04-23 NOTE — Progress Notes (Signed)
  [facility] Adult Case Management Discharge Plan :  Will you be returning to the same living situation after discharge:  Yes,  Home  At discharge, do you have transportation home?: Yes,  Friend  Do you have the ability to pay for your medications: Yes,  Insurance   Release of information consent forms completed and in the chart;  Patient's signature needed at discharge.  Patient to Follow up at:  Follow-up Information    [facility] Go on 04/30/2020.   Why: You have an appointment on 04/30/20 at 11:00 am for therapy services.  This appointment will be held in person. * PLEASE CALL TO CONFIRM THIS APPOINTMENT AND RECIEVE DETAILS.  Contact information: [address] [place] [address] [phone]        [facility] Follow up on 05/04/2020.   Why: You have an appointment for medication management services with your primary care provider on 05/04/20 at 10:00 am.  This appointment will be held in person.  Contact information: [facility]  [address] [place], [place] [address]  Phone: [phone]               Next level of care provider has access to [facility] Link:no  Safety Planning and Suicide Prevention discussed: Yes,  with patient   Have you used any form of tobacco in the last 30 days? (Cigarettes, Smokeless Tobacco, Cigars, and/or Pipes): Yes  Has patient been referred to the Quitline?: Patient refused referral  Patient has been referred for addiction treatment: N/A  Aram Beecham, LCSWA 04/23/2020, 3:28 PM

## 2020-04-23 NOTE — BHH Suicide Risk Assessment (Signed)
B[facility] INPATIENT:  Family/Significant Other Suicide Prevention Education  Suicide Prevention Education:  Contact Attempts: Stasia Cavalier [phone] (AA friend) has been identified by the patient as the family member/significant other with whom the patient will be residing, and identified as the person(s) who will aid the patient in the event of a mental health crisis.  With written consent from the patient, two attempts were made to provide suicide prevention education, prior to and/or following the patient's discharge.  We were unsuccessful in providing suicide prevention education.  A suicide education pamphlet was given to the patient to share with family/significant other.  Date and time of first attempt: 04/22/2020 at 1:00pm Date and time of second attempt: 04/23/2020 at 3:20pm  CSW left a HIPAA approved voicemail asking for a return call.  Patient did not have another number for her friend.   Metro Kung Elmond Poehlman 04/23/2020, 3:22 PM

## 2020-04-23 NOTE — Progress Notes (Signed)
Discharge Note:  Patient denies SI/HI AVH at this time. Discharge instructions, AVS, prescriptions and transition record gone over with patient. Patient agrees to comply with medication management, follow-up visit, and outpatient therapy. Patient belongings returned to patient. Patient questions and concerns addressed and answered.  Patient ambulatory off unit.  Patient discharged to home with friend.   

## 2020-04-23 NOTE — Progress Notes (Signed)
Recreation Therapy Notes  Date:  3.28.22 Time: 0930 Location: 300 Hall Dayroom  Group Topic: Stress Management  Goal Area(s) Addresses:  Patient will identify positive stress management techniques. Patient will identify benefits of using stress management post d/c.  Intervention: Stress Management  Activity: Meditation.  LRT played a meditation that got the patients to take inventory of how their bodies and any tension or pain they may be feeling and try ease by focusing and using breathing techniques.  Patients were to focus as the meditation played to fully engage in the activity.    Education:  Stress Management, Discharge Planning.   Education Outcome: Acknowledges Education  Clinical Observations/Feedback: Pt did not attend group session.    Caroll Rancher, LRT/CTRS         Caroll Rancher A 04/23/2020 11:48 AM

## 2020-04-23 NOTE — Progress Notes (Incomplete)
[facility] MD Progress Note  04/23/2020 11:19 AM Norma Elliott  MRN:  [id number]   Chief Complaint: depression  Subjective: Brittani Purdum is a 40 y.o. female with a past psychiatric history significant for alcohol dependence, substance-induced mood disorder, alcohol withdrawal, and insomnia who presented as a walk-in to the behavioral health hospital on 04/21/2020 with increased depression and suicidal ideation.The patient is currently on Hospital Day 2.   Chart Review from last 24 hours:  The patient's chart was reviewed and nursing notes were reviewed. The patient's case was discussed in multidisciplinary team meeting. Per [facility]   Information Obtained Today During Patient Interview: The patient was seen and evaluated on the unit. On assessment today the patient reports ***  Principal Problem: MDD (major depressive disorder), recurrent severe, without psychosis (HCC) Diagnosis: Principal Problem:   MDD (major depressive disorder), recurrent severe, without psychosis (HCC)  Total Time Spent in Direct Patient Care:  I personally spent *** minutes on the unit in direct patient care. The direct patient care time included face-to-face time with the patient, reviewing the patient's chart, communicating with other professionals, and coordinating care. Greater than 50% of this time was spent in counseling or coordinating care with the patient regarding goals of hospitalization, psycho-education, and discharge planning needs.  Past Psychiatric History: see admission H&P  Past Medical History:  Past Medical History:  Diagnosis Date  . ADD (attention deficit disorder)   . Depression   . Esophageal mass   . History of fainting spells of unknown cause   . Hyperlipemia   . Insomnia   . Migraines   . Tension headache     Past Surgical History:  Procedure Laterality Date  . APPENDECTOMY     Family History:  Family History  Problem Relation Age of Onset  . Cancer Mother   . Arthritis Mother   . Hearing  loss Mother   . Hyperlipidemia Mother   . Heart disease Mother   . COPD Father   . Hearing loss Father   . Hyperlipidemia Father   . Arthritis Maternal Grandmother   . Depression Maternal Grandmother   . Hearing loss Maternal Grandmother   . Mental retardation Maternal Grandmother   . Hyperlipidemia Maternal Grandfather   . Hypertension Maternal Grandfather   . Heart disease Maternal Grandfather   . Heart attack Maternal Grandfather   . Stroke Maternal Grandfather   . Diabetes Paternal Grandmother   . Kidney disease Paternal Grandmother   . Hearing loss Paternal Grandfather   . Heart disease Paternal Grandfather    Family Psychiatric  History: see admission H&P  Social History:  Social History   Substance and Sexual Activity  Alcohol Use Yes  . Alcohol/week: 12.0 standard drinks  . Types: 10 Glasses of wine, 2 Standard drinks or equivalent per week   Comment: drinks 1-2 bottles of wine or of vodka     Social History   Substance and Sexual Activity  Drug Use No    Social History   Socioeconomic History  . Marital status: Single    Spouse name: Not on file  . Number of children: 0  . Years of education: Not on file  . Highest education level: Not on file  Occupational History  . Occupation: Network engineer, Geophysicist/field seismologist services for companies    Employer: [employer]  Tobacco Use  . Smoking status: Former Smoker    Types: E-cigarettes  . Smokeless tobacco: Never Used  . Tobacco comment: smoking recently needs patch  Vaping Use  . Vaping Use: Former  Substance and Sexual Activity  . Alcohol use: Yes    Alcohol/week: 12.0 standard drinks    Types: 10 Glasses of wine, 2 Standard drinks or equivalent per week    Comment: drinks 1-2 bottles of wine or of vodka  . Drug use: No  . Sexual activity: Yes    Birth control/protection: Pill  Other Topics Concern  . Not on file  Social History Narrative  . Not on file   Social Determinants of  Health   Financial Resource Strain: Not on file  Food Insecurity: Not on file  Transportation Needs: Not on file  Physical Activity: Not on file  Stress: Not on file  Social Connections: Not on file   Additional Social History:    Pain Medications: See MAR Prescriptions: See MAR Over the Counter: See MAR History of alcohol / drug use?: Yes Longest period of sobriety (when/how long): 30 days over the last couple of years. Was clean for 30 days and relapsed on March 14th Negative Consequences of Use: Personal relationships,Work / School Withdrawal Symptoms: Patient aware of relationship between substance abuse and physical/medical complications Name of Substance 1: ETOH 1 - Age of First Use: 18 1 - Amount (size/oz): 1-2 bottles of wine or of vodka 1 - Frequency: daily since March 14th after 30 days sober 1 - Duration: on and off for the past 2 years regularly 1 - Last Use / Amount: 04/20/2020 1 - Method of Aquiring: Store 1- Route of Use: Oral  Sleep: {BHH GOOD/FAIR/POOR:22877}  Appetite:  {BHH GOOD/FAIR/POOR:22877}  Current Medications: Current Facility-Administered Medications  Medication Dose Route Frequency Provider Last Rate Last Admin  . acetaminophen (TYLENOL) tablet 650 mg  650 mg Oral Q6H PRN Jaclyn Shaggy, PA-C   650 mg at 04/22/20 1802  . alum & mag hydroxide-simeth (MAALOX/MYLANTA) 200-200-20 MG/5ML suspension 30 mL  30 mL Oral Q4H PRN Melbourne Abts W, PA-C      . gabapentin (NEURONTIN) capsule 100 mg  100 mg Oral BID Antonieta Pert, MD   100 mg at 04/22/20 1707  . hydrOXYzine (ATARAX/VISTARIL) tablet 25 mg  25 mg Oral Q6H PRN Jaclyn Shaggy, PA-C   25 mg at 04/22/20 1802  . loperamide (IMODIUM) capsule 2-4 mg  2-4 mg Oral PRN Jaclyn Shaggy, PA-C      . LORazepam (ATIVAN) tablet 1 mg  1 mg Oral Q6H PRN Jaclyn Shaggy, PA-C   1 mg at 04/21/20 2112  . magnesium hydroxide (MILK OF MAGNESIA) suspension 30 mL  30 mL Oral Daily PRN Jaclyn Shaggy, PA-C      .  multivitamin with minerals tablet 1 tablet  1 tablet Oral Daily Jaclyn Shaggy, PA-C   1 tablet at 04/22/20 7829  . nicotine (NICODERM CQ - dosed in mg/24 hours) patch 21 mg  21 mg Transdermal Daily Armandina Stammer I, NP   21 mg at 04/22/20 0728  . ondansetron (ZOFRAN-ODT) disintegrating tablet 4 mg  4 mg Oral Q6H PRN Jaclyn Shaggy, PA-C      . OXcarbazepine (TRILEPTAL) tablet 150 mg  150 mg Oral QHS PRN Antonieta Pert, MD   150 mg at 04/21/20 2112  . thiamine tablet 100 mg  100 mg Oral Daily Melbourne Abts W, PA-C   100 mg at 04/22/20 5621  . traZODone (DESYREL) tablet 100 mg  100 mg Oral QHS PRN Melbourne Abts W, PA-C   100 mg at  04/21/20 2112    Lab Results: No results found for this or any previous visit (from the past 48 hour(s)).  Blood Alcohol level:  Lab Results  Component Value Date   ETH <10 04/21/2020   ETH 242 (H) 11/16/2019    Metabolic Disorder Labs: Lab Results  Component Value Date   HGBA1C 5.7 (H) 11/18/2019   MPG 117 11/18/2019   No results found for: PROLACTIN Lab Results  Component Value Date   CHOL 211 (H) 11/18/2019   TRIG 77 11/18/2019   HDL 80 11/18/2019   CHOLHDL 2.6 11/18/2019   VLDL 15 11/18/2019   LDLCALC 116 (H) 11/18/2019    Physical Findings: AIMS: Facial and Oral Movements Muscles of Facial Expression: None, normal Lips and Perioral Area: None, normal Jaw: None, normal Tongue: None, normal,Extremity Movements Upper (arms, wrists, hands, fingers): None, normal Lower (legs, knees, ankles, toes): None, normal, Trunk Movements Neck, shoulders, hips: None, normal, Overall Severity Severity of abnormal movements (highest score from questions above): None, normal Incapacitation due to abnormal movements: None, normal Patient's awareness of abnormal movements (rate only patient's report): No Awareness, Dental Status Current problems with teeth and/or dentures?: No Does patient usually wear dentures?: No  CIWA:  CIWA-Ar Total: 1     Musculoskeletal: Strength & Muscle Tone: {desc; muscle tone:32375} Gait & Station: {PE GAIT ED ZOXW:96045}ATL:22525} Patient leans: {Patient Leans:21022755}  Psychiatric Specialty Exam: Psychiatric Specialty Exam: Physical Exam  Review of Systems  Blood pressure 119/74, pulse 81, temperature 98.4 F (36.9 C), temperature source Oral, resp. rate 18, height 5\' 5"  (1.651 m), weight 53.5 kg, last menstrual period 04/20/2020, SpO2 100 %.Body mass index is 19.64 kg/m.  General Appearance: {Appearance:22683}  Eye Contact:  {BHH EYE CONTACT:22684}  Speech:  {Speech:22685}  Volume:  {Volume (PAA):22686}  Mood:  {BHH MOOD:22306}  Affect:  {Affect (PAA):22687}  Thought Process:  {Thought Process (PAA):22688}  Orientation:  {BHH ORIENTATION (PAA):22689}  Thought Content:  {Thought Content:22690}  Suicidal Thoughts:  {ST/HT (PAA):22692}  Homicidal Thoughts:  {ST/HT (PAA):22692}  Memory:  {BHH MEMORY:22881}  Judgement:  {Judgement (PAA):22694}  Insight:  {Insight (PAA):22695}  Psychomotor Activity:  {Psychomotor (PAA):22696}  Concentration:  {Concentration:21399}  Recall:  {BHH GOOD/FAIR/POOR:22877}  Fund of Knowledge:  {BHH GOOD/FAIR/POOR:22877}  Language:  {BHH GOOD/FAIR/POOR:22877}  Akathisia:  {BHH YES OR NO:22294}  Handed:  {Handed:22697}  AIMS (if indicated):     Assets:  {Assets (PAA):22698}  ADL's:  {BHH WUJ'W:11914}ADL'S:22290}  Cognition:  {chl bhh cognition:304700322}  Sleep:  Number of Hours: 6.75   Treatment Plan Summary: Diagnoses / Active Problems: MDD recurrent severe without psychotic features (r/o substance induced mood d/o) Alcohol use d/o  PLAN: 1. Safety and Monitoring:  -- Voluntary admission to inpatient psychiatric unit for safety, stabilization and treatment  -- Daily contact with patient to assess and evaluate symptoms and progress in treatment  -- Patient's case to be discussed in multi-disciplinary team meeting  -- Observation Level : q15 minute checks  -- Vital signs:   q12 hours  -- Precautions: suicide  2. Psychiatric Diagnoses and Treatment:   MDD recurrent severe without psychotic features (r/o substance induced mood d/o)  -- Continue   -- TSH 1.493 5 months ago  -- Encouraged patient to participate in unit milieu and in scheduled group therapies   -- Short Term Goals: Ability to identify changes in lifestyle to reduce recurrence of condition will improve and Ability to identify and develop effective coping behaviors will improve  -- Long Term Goals: Improvement in symptoms  so as ready for discharge   Alcohol use d/o  Cannabis use  -- ETOH <10 and UDS positive for THC on admission  -- Counseled on the need to abstain from alcohol and illicit substances after discharge  -- Continue Neurontin 100mg  tid  -- Continue CIWA protocol for withdrawal monitoring along with po MVI and thiamine replacement  -- Patient encouraged to consider outpatient SA treatment options after discharge  -- Short Term Goals: Ability to identify triggers associated with substance abuse/mental health issues will improve  -- Long Term Goals: Improvement in symptoms so as ready for discharge   3. Medical Issues Being Addressed:   Tobacco Use Disorder  -- Nicotine patch 21mg /24 hours ordered  -- Smoking cessation encouraged   Glucose 105 on admission  -- review of records shows HbgA1c 5.7 5 months ago   Ca+ 8.4 on admission  -- Review of records shows was 8.8 5 months ago  -- will need recheck by PCP after discharge  4. Discharge Planning:   -- Social work and case management to assist with discharge planning and identification of hospital follow-up needs prior to discharge  -- Estimated LOS: 5-7 days  -- Discharge Concerns: Need to establish a safety plan; Medication compliance and effectiveness  -- Discharge Goals: Return home with outpatient referrals for mental health follow-up including medication management/psychotherapy   , MD,FAPA 04/23/2020, 11:19  AM

## 2020-04-23 NOTE — Tx Team (Signed)
Interdisciplinary Treatment and Diagnostic Plan Update  04/23/2020 Time of Session: Norma Elliott MRN: [id number]  Principal Diagnosis: MDD (major depressive disorder), recurrent severe, without psychosis ([place])  Secondary Diagnoses: Principal Problem:   MDD (major depressive disorder), recurrent severe, without psychosis ([place])   Current Medications:  Current Facility-Administered Medications  Medication Dose Route Frequency Provider Last Rate Last Admin  . acetaminophen (TYLENOL) tablet 650 mg  650 mg Oral Q6H PRN Prescilla Sours, PA-C   650 mg at 04/22/20 1802  . alum & mag hydroxide-simeth (MAALOX/MYLANTA) 200-200-20 MG/5ML suspension 30 mL  30 mL Oral Q4H PRN Margorie John W, PA-C      . gabapentin (NEURONTIN) capsule 100 mg  100 mg Oral BID Sharma Covert, MD   100 mg at 04/22/20 1707  . hydrOXYzine (ATARAX/VISTARIL) tablet 25 mg  25 mg Oral Q6H PRN Prescilla Sours, PA-C   25 mg at 04/22/20 1802  . loperamide (IMODIUM) capsule 2-4 mg  2-4 mg Oral PRN Prescilla Sours, PA-C      . LORazepam (ATIVAN) tablet 1 mg  1 mg Oral Q6H PRN Prescilla Sours, PA-C   1 mg at 04/21/20 2112  . magnesium hydroxide (MILK OF MAGNESIA) suspension 30 mL  30 mL Oral Daily PRN Prescilla Sours, PA-C      . multivitamin with minerals tablet 1 tablet  1 tablet Oral Daily Prescilla Sours, PA-C   1 tablet at 04/22/20 2831  . nicotine (NICODERM CQ - dosed in mg/24 hours) patch 21 mg  21 mg Transdermal Daily Lindell Spar I, NP   21 mg at 04/23/20 1431  . ondansetron (ZOFRAN-ODT) disintegrating tablet 4 mg  4 mg Oral Q6H PRN Prescilla Sours, PA-C      . OXcarbazepine (TRILEPTAL) tablet 150 mg  150 mg Oral QHS PRN Sharma Covert, MD   150 mg at 04/21/20 2112  . thiamine tablet 100 mg  100 mg Oral Daily Margorie John W, PA-C   100 mg at 04/22/20 5176  . traZODone (DESYREL) tablet 100 mg  100 mg Oral QHS PRN Prescilla Sours, PA-C   100 mg at 04/21/20 2112   PTA Medications: Medications Prior to Admission  Medication Sig  Dispense Refill Last Dose  . clonazePAM (KLONOPIN) 1 MG tablet Take 2 mg by mouth at bedtime.     . traZODone (DESYREL) 100 MG tablet Take 1 tablet (100 mg total) by mouth at bedtime. 90 tablet 3     Patient Stressors: Financial difficulties Substance abuse Other: Work stress  Patient Strengths: Ability for insight Average or above average Architect for treatment/growth Physical Health Supportive family/friends  Treatment Modalities: Medication Management, Group therapy, Case management,  1 to 1 session with clinician, Psychoeducation, Recreational therapy.   Physician Treatment Plan for Primary Diagnosis: MDD (major depressive disorder), recurrent severe, without psychosis ([place]) Long Term Goal(s): Improvement in symptoms so as ready for discharge Improvement in symptoms so as ready for discharge   Short Term Goals: Ability to identify changes in lifestyle to reduce recurrence of condition will improve Ability to identify and develop effective coping behaviors will improve Ability to identify triggers associated with substance abuse/mental health issues will improve  Medication Management: Evaluate patient's response, side effects, and tolerance of medication regimen.  Therapeutic Interventions: 1 to 1 sessions, Unit Group sessions and Medication administration.  Evaluation of Outcomes: Not Met  Physician Treatment Plan for Secondary Diagnosis: Principal Problem:   MDD (major depressive disorder), recurrent  severe, without psychosis ([place])  Long Term Goal(s): Improvement in symptoms so as ready for discharge Improvement in symptoms so as ready for discharge   Short Term Goals: Ability to identify changes in lifestyle to reduce recurrence of condition will improve Ability to identify and develop effective coping behaviors will improve Ability to identify triggers associated with substance abuse/mental health issues will improve      Medication Management: Evaluate patient's response, side effects, and tolerance of medication regimen.  Therapeutic Interventions: 1 to 1 sessions, Unit Group sessions and Medication administration.  Evaluation of Outcomes: Not Met   RN Treatment Plan for Primary Diagnosis: MDD (major depressive disorder), recurrent severe, without psychosis ([place]) Long Term Goal(s): Knowledge of disease and therapeutic regimen to maintain health will improve  Short Term Goals: Ability to verbalize frustration and anger appropriately will improve, Ability to participate in decision making will improve and Compliance with prescribed medications will improve  Medication Management: RN will administer medications as ordered by provider, will assess and evaluate patient's response and provide education to patient for prescribed medication. RN will report any adverse and/or side effects to prescribing provider.  Therapeutic Interventions: 1 on 1 counseling sessions, Psychoeducation, Medication administration, Evaluate responses to treatment, Monitor vital signs and CBGs as ordered, Perform/monitor CIWA, COWS, AIMS and Fall Risk screenings as ordered, Perform wound care treatments as ordered.  Evaluation of Outcomes: Not Met   LCSW Treatment Plan for Primary Diagnosis: MDD (major depressive disorder), recurrent severe, without psychosis ([place]) Long Term Goal(s): Safe transition to appropriate next level of care at discharge, Engage patient in therapeutic group addressing interpersonal concerns.  Short Term Goals: Engage patient in aftercare planning with referrals and resources, Increase social support and Increase emotional regulation  Therapeutic Interventions: Assess for all discharge needs, 1 to 1 time with Social worker, Explore available resources and support systems, Assess for adequacy in community support network, Educate family and significant other(s) on suicide prevention, Complete Psychosocial  Assessment, Interpersonal group therapy.  Evaluation of Outcomes: Not Met   Progress in Treatment: Attending groups: Yes. Participating in groups: Yes. Taking medication as prescribed: Yes. Toleration medication: Yes. Family/Significant other contact made: No, will contact:  Aa friend Patient understands diagnosis: Yes. Discussing patient identified problems/goals with staff: No. Medical problems stabilized or resolved: Yes. Denies suicidal/homicidal ideation: Yes. Issues/concerns per patient self-inventory: No. Other: None  New problem(s) identified: No, Describe:  None  New Short Term/Long Term Goal(s):medication stabilization, elimination of SI thoughts, development of comprehensive mental wellness plan.  Patient Goals:  Did not attend  Discharge Plan or Barriers: Patient recently admitted. CSW will continue to follow and assess for appropriate referrals and possible discharge planning.  Reason for Continuation of Hospitalization: Depression Medication stabilization Suicidal ideation  Estimated Length of Stay: 3-5 days  Attendees: Patient:  04/23/2020   Physician: Myles Lipps, MD 04/23/2020   Nursing:  04/23/2020  RN Care Manager: 04/23/2020   Social Worker: Verdis Frederickson, [place] 04/23/2020   Recreational Therapist:  04/23/2020   Other:  04/23/2020   Other:  04/23/2020   Other: 04/23/2020      Scribe for Treatment Team: Mliss Fritz, Latanya Presser 04/23/2020 2:49 PM

## 2020-04-23 NOTE — Progress Notes (Signed)
[facility] MD Progress Note  04/23/2020 10:46 AM Norma Elliott  MRN:  [id number] Subjective: Patient is a 40 year old female with a past psychiatric history significant for alcohol dependence, substance-induced mood disorder, alcohol withdrawal and insomnia who presented as a walk-in to the behavioral health hospital on 04/21/2020 with increased depression and suicidal ideation.  Objective: Patient is seen and examined.  Patient is a 40 year old female with the above-stated past psychiatric history who is seen in follow-up.  Yesterday she seemed to be doing better, but this morning she had a phone call and was very distraught about that afterwards.  She came into talk to me but was unable to disclose what was bothering her.  She stated that someone "did not have any understanding".  That they had been "cruel".  I asked her if it was staff or some other patient and she declined to answer that question.  I did ask if it was something I had done and she denied that.  Review of the electronic medical record revealed a note from nursing that stated she had been on the telephone, and apparently that had not gone well.  She was tearful after that.  She was not willing to do any further examination today.  Nursing notes show she slept well last night.  And 5 hours was recorded.  Additionally her CIWA this a.m. was only 1.  No new laboratories.  Principal Problem: MDD (major depressive disorder), recurrent severe, without psychosis ([facility]) Diagnosis: Principal Problem:   MDD (major depressive disorder), recurrent severe, without psychosis ([facility])  Total Time spent with patient: 20 minutes  Past Psychiatric History: See admission H&P  Past Medical History:  Past Medical History:  Diagnosis Date  . ADD (attention deficit disorder)   . Depression   . Esophageal mass   . History of fainting spells of unknown cause   . Hyperlipemia   . Insomnia   . Migraines   . Tension headache     Past Surgical History:  Procedure  Laterality Date  . APPENDECTOMY     Family History:  Family History  Problem Relation Age of Onset  . Cancer Mother   . Arthritis Mother   . Hearing loss Mother   . Hyperlipidemia Mother   . Heart disease Mother   . COPD Father   . Hearing loss Father   . Hyperlipidemia Father   . Arthritis Maternal Grandmother   . Depression Maternal Grandmother   . Hearing loss Maternal Grandmother   . Mental retardation Maternal Grandmother   . Hyperlipidemia Maternal Grandfather   . Hypertension Maternal Grandfather   . Heart disease Maternal Grandfather   . Heart attack Maternal Grandfather   . Stroke Maternal Grandfather   . Diabetes Paternal Grandmother   . Kidney disease Paternal Grandmother   . Hearing loss Paternal Grandfather   . Heart disease Paternal Grandfather    Family Psychiatric  History: See admission H&P Social History:  Social History   Substance and Sexual Activity  Alcohol Use Yes  . Alcohol/week: 12.0 standard drinks  . Types: 10 Glasses of wine, 2 Standard drinks or equivalent per week   Comment: drinks 1-2 bottles of wine or of vodka     Social History   Substance and Sexual Activity  Drug Use No    Social History   Socioeconomic History  . Marital status: Single    Spouse name: Not on file  . Number of children: 0  . Years of education: Not on file  .  Highest education level: Not on file  Occupational History  . Occupation: Network engineer, Geophysicist/field seismologist services for companies    Employer: [employer]  Tobacco Use  . Smoking status: Former Smoker    Types: E-cigarettes  . Smokeless tobacco: Never Used  . Tobacco comment: smoking recently needs patch  Vaping Use  . Vaping Use: Former  Substance and Sexual Activity  . Alcohol use: Yes    Alcohol/week: 12.0 standard drinks    Types: 10 Glasses of wine, 2 Standard drinks or equivalent per week    Comment: drinks 1-2 bottles of wine or of vodka  . Drug use: No  . Sexual  activity: Yes    Birth control/protection: Pill  Other Topics Concern  . Not on file  Social History Narrative  . Not on file   Social Determinants of Health   Financial Resource Strain: Not on file  Food Insecurity: Not on file  Transportation Needs: Not on file  Physical Activity: Not on file  Stress: Not on file  Social Connections: Not on file   Additional Social History:    Pain Medications: See MAR Prescriptions: See MAR Over the Counter: See MAR History of alcohol / drug use?: Yes Longest period of sobriety (when/how long): 30 days over the last couple of years. Was clean for 30 days and relapsed on March 14th Negative Consequences of Use: Personal relationships,Work / School Withdrawal Symptoms: Patient aware of relationship between substance abuse and physical/medical complications Name of Substance 1: ETOH 1 - Age of First Use: 18 1 - Amount (size/oz): 1-2 bottles of wine or of vodka 1 - Frequency: daily since March 14th after 30 days sober 1 - Duration: on and off for the past 2 years regularly 1 - Last Use / Amount: 04/20/2020 1 - Method of Aquiring: Store 1- Route of Use: Oral                  Sleep: Good  Appetite:  Fair  Current Medications: Current Facility-Administered Medications  Medication Dose Route Frequency Provider Last Rate Last Admin  . acetaminophen (TYLENOL) tablet 650 mg  650 mg Oral Q6H PRN Jaclyn Shaggy, PA-C   650 mg at 04/22/20 1802  . alum & mag hydroxide-simeth (MAALOX/MYLANTA) 200-200-20 MG/5ML suspension 30 mL  30 mL Oral Q4H PRN Melbourne Abts W, PA-C      . gabapentin (NEURONTIN) capsule 100 mg  100 mg Oral BID Antonieta Pert, MD   100 mg at 04/22/20 1707  . hydrOXYzine (ATARAX/VISTARIL) tablet 25 mg  25 mg Oral Q6H PRN Jaclyn Shaggy, PA-C   25 mg at 04/22/20 1802  . loperamide (IMODIUM) capsule 2-4 mg  2-4 mg Oral PRN Jaclyn Shaggy, PA-C      . LORazepam (ATIVAN) tablet 1 mg  1 mg Oral Q6H PRN Jaclyn Shaggy,  PA-C   1 mg at 04/21/20 2112  . magnesium hydroxide (MILK OF MAGNESIA) suspension 30 mL  30 mL Oral Daily PRN Jaclyn Shaggy, PA-C      . multivitamin with minerals tablet 1 tablet  1 tablet Oral Daily Jaclyn Shaggy, PA-C   1 tablet at 04/22/20 5852  . nicotine (NICODERM CQ - dosed in mg/24 hours) patch 21 mg  21 mg Transdermal Daily Armandina Stammer I, NP   21 mg at 04/22/20 0728  . ondansetron (ZOFRAN-ODT) disintegrating tablet 4 mg  4 mg Oral Q6H PRN Jaclyn Shaggy, PA-C      .  OXcarbazepine (TRILEPTAL) tablet 150 mg  150 mg Oral QHS PRN Antonieta Pert, MD   150 mg at 04/21/20 2112  . thiamine tablet 100 mg  100 mg Oral Daily Melbourne Abts W, PA-C   100 mg at 04/22/20 6213  . traZODone (DESYREL) tablet 100 mg  100 mg Oral QHS PRN Jaclyn Shaggy, PA-C   100 mg at 04/21/20 2112    Lab Results: No results found for this or any previous visit (from the past 48 hour(s)).  Blood Alcohol level:  Lab Results  Component Value Date   ETH <10 04/21/2020   ETH 242 (H) 11/16/2019    Metabolic Disorder Labs: Lab Results  Component Value Date   HGBA1C 5.7 (H) 11/18/2019   MPG 117 11/18/2019   No results found for: PROLACTIN Lab Results  Component Value Date   CHOL 211 (H) 11/18/2019   TRIG 77 11/18/2019   HDL 80 11/18/2019   CHOLHDL 2.6 11/18/2019   VLDL 15 11/18/2019   LDLCALC 116 (H) 11/18/2019    Physical Findings: AIMS: Facial and Oral Movements Muscles of Facial Expression: None, normal Lips and Perioral Area: None, normal Jaw: None, normal Tongue: None, normal,Extremity Movements Upper (arms, wrists, hands, fingers): None, normal Lower (legs, knees, ankles, toes): None, normal, Trunk Movements Neck, shoulders, hips: None, normal, Overall Severity Severity of abnormal movements (highest score from questions above): None, normal Incapacitation due to abnormal movements: None, normal Patient's awareness of abnormal movements (rate only patient's report): No Awareness, Dental  Status Current problems with teeth and/or dentures?: No Does patient usually wear dentures?: No  CIWA:  CIWA-Ar Total: 1 COWS:     Musculoskeletal: Strength & Muscle Tone: within normal limits Gait & Station: normal Patient leans: N/A  Psychiatric Specialty Exam:  Presentation  General Appearance: Disheveled  Eye Contact:Minimal  Speech:Normal Rate  Speech Volume:Decreased  Handedness:Right   Mood and Affect  Mood:Anxious; Depressed  Affect:Congruent   Thought Process  Thought Processes:Coherent  Descriptions of Associations:Circumstantial  Orientation:Full (Time, Place and Person)  Thought Content:Rumination  History of Schizophrenia/Schizoaffective disorder:No  Duration of Psychotic Symptoms:No data recorded Hallucinations:Hallucinations: None  Ideas of Reference:None  Suicidal Thoughts:Suicidal Thoughts: Yes, Passive SI Passive Intent and/or Plan: Without Intent  Homicidal Thoughts:Homicidal Thoughts: No   Sensorium  Memory:Immediate Fair; Recent Fair; Remote Fair  Judgment:Fair  Insight:Fair   Executive Functions  Concentration:Fair  Attention Span:Fair  Recall:Fair  Fund of Knowledge:Fair  Language:Good   Psychomotor Activity  Psychomotor Activity:Psychomotor Activity: Increased   Assets  Assets:Desire for Improvement; Resilience   Sleep  Sleep:No data recorded   Physical Exam: Physical Exam Vitals and nursing note reviewed.  HENT:     Head: Normocephalic and atraumatic.  Pulmonary:     Effort: Pulmonary effort is normal.  Neurological:     General: No focal deficit present.     Mental Status: She is alert and oriented to person, place, and time.    ROS Blood pressure 119/74, pulse 81, temperature 98.4 F (36.9 C), temperature source Oral, resp. rate 18, height 5\' 5"  (1.651 m), weight 53.5 kg, last menstrual period 04/20/2020, SpO2 100 %. Body mass index is 19.64 kg/m.   Treatment Plan Summary: Daily contact  with patient to assess and evaluate symptoms and progress in treatment, Medication management and Plan : Patient is seen and examined.  Patient is a 40 year old female with the above-stated past psychiatric history who is seen in follow-up.   Diagnosis: 1.  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  Generalized anxiety disorder. 3.  Insomnia. 4.  Alcohol dependence. 5.  Uncomplicated alcohol withdrawal  Pertinent findings on examination today: 1.  Patient is upset and tearful this morning.  Unclear exactly what the origin of that is. 2.  Sleep appears to be adequate, and alcohol withdrawal symptoms are minimal.  Plan: 1.  Continue Imodium 2 to 4 mg p.o. as needed diarrhea or loose stool. 2.  Increase gabapentin to 100 mg p.o. 3 times daily for anxiety.  This is for alcohol dependence as well. 3.  Continue lorazepam 1 mg p.o. every 6 hours as needed a CIWA greater than 10. 4.  Continue Zofran 4 mg p.o. every 6 hours as needed nausea and vomiting. 5.  Continue multivitamin 1 tablet p.o. daily for nutritional supplementation. 6.  Continue Trileptal 150 mg p.o. nightly as needed insomnia. 7.  Continue thiamine 100 mg p.o. daily for nutritional supplementation. 8.  Continue trazodone 100 mg p.o. nightly as needed insomnia. 9.  We will most likely see patient again this afternoon for reevaluation. 10.  Disposition planning-in progress.  Antonieta PertGreg Lawson Alisson Rozell, MD 04/23/2020, 10:46 AM

## 2023-03-04 ENCOUNTER — Emergency Department (HOSPITAL_COMMUNITY): Payer: MEDICAID

## 2023-03-04 ENCOUNTER — Emergency Department (HOSPITAL_COMMUNITY)
Admission: EM | Admit: 2023-03-04 | Discharge: 2023-03-05 | Discharge status: Against Medical Advice | Payer: MEDICAID | Attending: Physician Assistant | Admitting: Physician Assistant

## 2023-03-04 ENCOUNTER — Other Ambulatory Visit: Payer: Self-pay

## 2023-03-04 ENCOUNTER — Encounter (HOSPITAL_COMMUNITY): Payer: Self-pay

## 2023-03-04 DIAGNOSIS — Z5321 Procedure and treatment not carried out due to patient leaving prior to being seen by health care provider: Secondary | ICD-10-CM | POA: Insufficient documentation

## 2023-03-04 DIAGNOSIS — R519 Headache, unspecified: Secondary | ICD-10-CM | POA: Diagnosis present

## 2023-03-04 LAB — COMPREHENSIVE METABOLIC PANEL
ALT: 23 U/L (ref 0–44)
AST: 17 U/L (ref 15–41)
Albumin: 4 g/dL (ref 3.5–5.0)
Alkaline Phosphatase: 57 U/L (ref 38–126)
Anion gap: 10 (ref 5–15)
BUN: 15 mg/dL (ref 6–20)
CO2: 26 mmol/L (ref 22–32)
Calcium: 9.4 mg/dL (ref 8.9–10.3)
Chloride: 101 mmol/L (ref 98–111)
Creatinine, Ser: 0.81 mg/dL (ref 0.44–1.00)
GFR, Estimated: >60 mL/min (ref >60)
Glucose, Bld: 165 mg/dL — ABNORMAL HIGH (ref 70–99)
Potassium: 3.9 mmol/L (ref 3.5–5.1)
Sodium: 137 mmol/L (ref 135–145)
Total Bilirubin: 0.4 mg/dL (ref 0.0–1.2)
Total Protein: 6.8 g/dL (ref 6.5–8.1)

## 2023-03-04 LAB — CBC WITH DIFFERENTIAL/PLATELET
Abs Immature Granulocytes: 0.01 10*3/uL (ref 0.00–0.07)
Basophils Absolute: 0 10*3/uL (ref 0.0–0.1)
Basophils Relative: 1 %
Eosinophils Absolute: 0.2 10*3/uL (ref 0.0–0.5)
Eosinophils Relative: 4 %
HCT: 42.2 % (ref 36.0–46.0)
Hemoglobin: 14.3 g/dL (ref 12.0–15.0)
Immature Granulocytes: 0 %
Lymphocytes Relative: 25 %
Lymphs Abs: 1.5 10*3/uL (ref 0.7–4.0)
MCH: 32.1 pg (ref 26.0–34.0)
MCHC: 33.9 g/dL (ref 30.0–36.0)
MCV: 94.8 fL (ref 80.0–100.0)
Monocytes Absolute: 0.3 10*3/uL (ref 0.1–1.0)
Monocytes Relative: 6 %
Neutro Abs: 3.7 10*3/uL (ref 1.7–7.7)
Neutrophils Relative %: 64 %
Platelets: 364 10*3/uL (ref 150–400)
RBC: 4.45 MIL/uL (ref 3.87–5.11)
RDW: 11.9 % (ref 11.5–15.5)
WBC: 5.8 10*3/uL (ref 4.0–10.5)
nRBC: 0 % (ref 0.0–0.2)

## 2023-03-04 LAB — HEPATITIS B SURFACE ANTIGEN: Hepatitis B Surface Ag: NONREACTIVE

## 2023-03-04 LAB — HEPATITIS C ANTIBODY: HCV Ab: NONREACTIVE

## 2023-03-04 LAB — RAPID HIV SCREEN (HIV 1/2 AB+AG)
HIV 1/2 Antibodies: NONREACTIVE
HIV-1 P24 Antigen - HIV24: NONREACTIVE
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
## 2023-03-05 LAB — RPR: RPR Ser Ql: NONREACTIVE

## 2023-03-05 NOTE — ED Notes (Signed)
 Pt eloped from lobby.

## 2023-03-13 ENCOUNTER — Emergency Department (HOSPITAL_BASED_OUTPATIENT_CLINIC_OR_DEPARTMENT_OTHER)
Admission: EM | Admit: 2023-03-13 | Discharge: 2023-03-13 | Disposition: A | Discharge status: Home / Self Care | Payer: No Typology Code available for payment source | Attending: Emergency Medicine | Admitting: Emergency Medicine

## 2023-03-13 ENCOUNTER — Other Ambulatory Visit: Payer: Self-pay

## 2023-03-13 ENCOUNTER — Encounter (HOSPITAL_BASED_OUTPATIENT_CLINIC_OR_DEPARTMENT_OTHER): Payer: Self-pay

## 2023-03-13 ENCOUNTER — Other Ambulatory Visit (HOSPITAL_BASED_OUTPATIENT_CLINIC_OR_DEPARTMENT_OTHER): Payer: Self-pay

## 2023-03-13 DIAGNOSIS — Y92410 Unspecified street and highway as the place of occurrence of the external cause: Secondary | ICD-10-CM | POA: Diagnosis not present

## 2023-03-13 DIAGNOSIS — M7918 Myalgia, other site: Secondary | ICD-10-CM | POA: Diagnosis present

## 2023-03-13 MED ORDER — LIDOCAINE 5 % EX PTCH
1.0000 | MEDICATED_PATCH | CUTANEOUS | 0 refills | Status: AC
  Filled 2023-03-13: qty 30, 30d supply, fill #0

## 2023-03-13 MED ORDER — METHOCARBAMOL 500 MG PO TABS
500.0000 mg | ORAL_TABLET | Freq: Two times a day (BID) | ORAL | 0 refills | Status: AC
  Filled 2023-03-13 (×2): qty 20, 10d supply, fill #0

## 2023-03-13 MED ORDER — HYDROCODONE-ACETAMINOPHEN 5-325 MG PO TABS
1.0000 | ORAL_TABLET | Freq: Four times a day (QID) | ORAL | 0 refills | Status: AC | PRN
  Filled 2023-03-13: qty 3, 1d supply, fill #0

## 2023-03-13 MED ORDER — LIDOCAINE 5 % EX PTCH
1.0000 | MEDICATED_PATCH | CUTANEOUS | Status: DC
  Administered 2023-03-13: 1 via TRANSDERMAL
  Filled 2023-03-13: qty 1

## 2023-03-13 MED ORDER — LIDOCAINE 5 % EX PTCH: 1.0000 | MEDICATED_PATCH | CUTANEOUS | 0 refills | Status: DC

## 2023-03-13 MED ORDER — METHOCARBAMOL 500 MG PO TABS: 500.0000 mg | ORAL_TABLET | Freq: Two times a day (BID) | ORAL | 0 refills | Status: DC

## 2023-03-13 MED ORDER — HYDROCODONE-ACETAMINOPHEN 5-325 MG PO TABS: 1.0000 | ORAL_TABLET | Freq: Four times a day (QID) | ORAL | 0 refills | Status: DC | PRN

## 2023-03-13 MED ORDER — METHOCARBAMOL 500 MG PO TABS
500.0000 mg | ORAL_TABLET | Freq: Once | ORAL | Status: AC
  Administered 2023-03-13: 500 mg via ORAL
  Filled 2023-03-13: qty 1

## 2023-03-13 MED ORDER — KETOROLAC TROMETHAMINE 15 MG/ML IJ SOLN
15.0000 mg | Freq: Once | INTRAMUSCULAR | Status: AC
  Administered 2023-03-13: 15 mg via INTRAMUSCULAR
  Filled 2023-03-13: qty 1
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
## 2023-03-13 NOTE — ED Notes (Signed)
Pt given discharge instructions and reviewed prescriptions. Opportunities given for questions. Pt verbalizes understanding. Jillyn Hidden, RN
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
